# Patient Record
Sex: Female | Born: 2000 | Race: Black or African American | Hispanic: No | Marital: Single | Smoking: Never smoker
Health system: Southern US, Community
[De-identification: ages and names within clinical notes are randomized; demographics above are authoritative.]

## PROBLEM LIST (undated history)

## (undated) DIAGNOSIS — L309 Dermatitis, unspecified: Secondary | ICD-10-CM

## (undated) DIAGNOSIS — E669 Obesity, unspecified: Secondary | ICD-10-CM

## (undated) DIAGNOSIS — F419 Anxiety disorder, unspecified: Secondary | ICD-10-CM

## (undated) DIAGNOSIS — J309 Allergic rhinitis, unspecified: Secondary | ICD-10-CM

## (undated) DIAGNOSIS — F32A Depression, unspecified: Secondary | ICD-10-CM

## (undated) HISTORY — DX: Anxiety disorder, unspecified: F41.9

## (undated) HISTORY — DX: Dermatitis, unspecified: L30.9

## (undated) HISTORY — DX: Depression, unspecified: F32.A

## (undated) HISTORY — DX: Allergic rhinitis, unspecified: J30.9

## (undated) HISTORY — PX: WISDOM TOOTH EXTRACTION: SHX21

## (undated) HISTORY — DX: Obesity, unspecified: E66.9

---
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
## 2022-08-16 ENCOUNTER — Encounter: Payer: Self-pay | Admitting: Obstetrics and Gynecology

## 2022-08-16 ENCOUNTER — Ambulatory Visit (INDEPENDENT_AMBULATORY_CARE_PROVIDER_SITE_OTHER): Payer: Medicaid Other | Admitting: Obstetrics and Gynecology

## 2022-08-16 VITALS — BP 111/75 | HR 74 | Wt 242.0 lb

## 2022-08-16 DIAGNOSIS — N906 Unspecified hypertrophy of vulva: Secondary | ICD-10-CM

## 2022-08-16 DIAGNOSIS — N907 Vulvar cyst: Secondary | ICD-10-CM | POA: Diagnosis not present

## 2022-08-16 NOTE — Progress Notes (Signed)
Pt states she is having some vaginal irritation and ?knots on labia.  Pt states this is an ongoing problem.

## 2022-08-16 NOTE — Progress Notes (Signed)
  CC: labial irritation Subjective:    Patient ID: Heidi Lowe, female    DOB: Dec 04, 2001, 21 y.o.   MRN: [id number]  HPI 21 yo GO seen for ongoing labial irritation.  Pt was seen previously the year before for a similar issue and was advised to try topical gel/vaseline to ease irritation.  This has proven to be ineffective, and the patient still complains about her extra labial skin rubbing on her undergarments. Briefly discussed body dysmorphism, but patient does not believe this is the case. She has been sexually active in the past, but is not currently.  The patient also complains of " painful bumps" in the right labia which have been present for about 2 months.     Review of Systems     Objective:   Physical Exam Vitals:   08/16/22 0854  BP: 111/75  Pulse: 74   External genitalia:  bilateral large pendulous labia majora (photos added to media) In the posterior right labia there are visible and palpable masses approximately 1 cm in size in a chain.  They are mildly tender to the touch.       Assessment & Plan:   1. Labial hypertrophy Discussed patient with partners.  Consensus was if the labia are causing persistent discomfort and less invasive treatment have been ineffective, then labioplasty may be an option.  Discussed with Dr. Despina Hidden and pt may be scheduled if she would like to go that route.  2. Epidermoid cyst of labia majora Removal at time of labioplasty if possible.  Awaiting pt's decision on treatment options.    Warden Fillers, MD Faculty Attending, [facility]

## 2022-09-15 ENCOUNTER — Encounter (HOSPITAL_COMMUNITY): Payer: Self-pay | Admitting: Obstetrics & Gynecology

## 2022-09-15 ENCOUNTER — Other Ambulatory Visit: Payer: Self-pay

## 2022-09-15 NOTE — Progress Notes (Signed)
PCP - Dr. Thornell Sartorius, N.  Cardiologist - Denies  EP- Denies  Endocrine- Denies  Pulm- Denies  Chest x-ray - Denies  EKG - Denies  Stress Test - Denies  ECHO - Denies  Cardiac Cath - Denies  AICD-na PM-na LOOP-na  Nerve Stimulator- Denies  Dialysis- Denies  Sleep Study - Denies CPAP - Denies  LABS- 09/16/22: UPT  ASA- Denies  ERAS- No  HA1C- Denies  Anesthesia- No  Pt denies having chest pain, sob, or fever during the pre-op phone call. All instructions explained to the pt, with a verbal understanding of the material. Pt also instructed to wear a mask and social distance if she goes out. The opportunity to ask questions was provided.

## 2022-09-15 NOTE — Progress Notes (Signed)
S.D.W- Instructions   Your procedure is scheduled on Thurs., Oct. 12, 2023 from 10:00AM-11:00AM.  Report to [facility] Main Entrance "A" at 7:30 A.M., then check in with the Admitting office.  Call this number if you have problems the morning of surgery:  [phone]   Remember:  Do not eat or drink after midnight on Oct. 11th    Take these medicines the morning of surgery with A SIP OF WATER:  None  As of today, STOP taking any Aspirin (unless otherwise instructed by your surgeon) Aleve, Naproxen, Ibuprofen, Motrin, Advil, Goody's, BC's, all herbal medications, fish oil, and all vitamins.  Do not wear jewelry or makeup. Do not wear lotions, powders, perfumes or deodorant. Do not shave 48 hours prior to surgery.   Do not bring valuables to the hospital. Do not wear nail polish, gel polish, artificial nails, or any other type of covering on natural nails (fingers and toes) If you have artificial nails or gel coating that need to be removed by a nail salon, please have this removed prior to surgery. Artificial nails or gel coating may interfere with anesthesia's ability to adequately monitor your vital signs.  [facility] is not responsible for any belongings or valuables.    Do NOT Smoke (Tobacco/Vaping)  24 hours prior to your procedure  If you use a CPAP at night, you may bring your mask for your overnight stay.   Contacts, glasses, hearing aids, dentures or partials may not be worn into surgery, please bring cases for these belongings   For patients admitted to the hospital, discharge time will be determined by your treatment team.   Patients discharged the day of surgery will not be allowed to drive home, and someone needs to stay with them for 24 hours.  Special instructions:    Oral Hygiene is also important to reduce your risk of infection.  Remember - BRUSH YOUR TEETH THE MORNING OF SURGERY WITH YOUR REGULAR TOOTHPASTE  - Preparing For Surgery  Before  surgery, you can play an important role. Because skin is not sterile, your skin needs to be as free of germs as possible. You can reduce the number of germs on your skin by washing with Antibacterial Soap before surgery.     Please follow these instructions carefully.     Shower the NIGHT BEFORE SURGERY and the MORNING OF SURGERY with Antibacterial Soap.   Pat yourself dry with a CLEAN TOWEL.  Wear CLEAN PAJAMAS to bed the night before surgery  Place CLEAN SHEETS on your bed the night before your surgery  DO NOT SLEEP WITH PETS.  Day of Surgery:  Take a shower with Antibacterial soap. Wear Clean/Comfortable clothing the morning of surgery Do not apply any deodorants/lotions.   Remember to brush your teeth WITH YOUR REGULAR TOOTHPASTE.   If you test positive for Covid, or been in contact with anyone that has tested positive in the last 10 days, please notify your surgeon.  SURGICAL WAITING ROOM VISITATION Patients having surgery or a procedure may have no more than 2 support people in the waiting area - these visitors may rotate.   Children under the age of 61 must have an adult with them who is not the patient. If the patient needs to stay at the hospital during part of their recovery, the visitor guidelines for inpatient rooms apply. Pre-op nurse will coordinate an appropriate time for 1 support person to accompany patient in pre-op.  This support person may not rotate.  Please refer to the [facility] website for the visitor guidelines for Inpatients (after your surgery is over and you are in a regular room).

## 2022-09-16 ENCOUNTER — Other Ambulatory Visit: Payer: Self-pay

## 2022-09-16 ENCOUNTER — Ambulatory Visit (HOSPITAL_BASED_OUTPATIENT_CLINIC_OR_DEPARTMENT_OTHER): Payer: Medicaid Other | Admitting: Certified Registered Nurse Anesthetist

## 2022-09-16 ENCOUNTER — Encounter (HOSPITAL_COMMUNITY): Payer: Self-pay | Admitting: Obstetrics & Gynecology

## 2022-09-16 ENCOUNTER — Other Ambulatory Visit: Payer: Self-pay | Admitting: Obstetrics & Gynecology

## 2022-09-16 ENCOUNTER — Ambulatory Visit (HOSPITAL_COMMUNITY)
Admission: RE | Admit: 2022-09-16 | Discharge: 2022-09-16 | Disposition: A | Discharge status: Home / Self Care | Payer: Medicaid Other | Source: Ambulatory Visit | Attending: Obstetrics & Gynecology | Admitting: Obstetrics & Gynecology

## 2022-09-16 ENCOUNTER — Encounter (HOSPITAL_COMMUNITY)
Admission: RE | Disposition: A | Discharge status: Home / Self Care | Payer: Self-pay | Source: Ambulatory Visit | Attending: Obstetrics & Gynecology

## 2022-09-16 ENCOUNTER — Ambulatory Visit (HOSPITAL_COMMUNITY): Payer: Medicaid Other | Admitting: Certified Registered Nurse Anesthetist

## 2022-09-16 DIAGNOSIS — N906 Unspecified hypertrophy of vulva: Secondary | ICD-10-CM

## 2022-09-16 DIAGNOSIS — N9089 Other specified noninflammatory disorders of vulva and perineum: Secondary | ICD-10-CM

## 2022-09-16 DIAGNOSIS — N9489 Other specified conditions associated with female genital organs and menstrual cycle: Secondary | ICD-10-CM | POA: Diagnosis not present

## 2022-09-16 DIAGNOSIS — N907 Vulvar cyst: Secondary | ICD-10-CM | POA: Diagnosis not present

## 2022-09-16 DIAGNOSIS — Z01818 Encounter for other preprocedural examination: Secondary | ICD-10-CM

## 2022-09-16 DIAGNOSIS — Z6841 Body Mass Index (BMI) 40.0 and over, adult: Secondary | ICD-10-CM

## 2022-09-16 HISTORY — PX: VULVECTOMY PARTIAL: SHX6187

## 2022-09-16 LAB — CBC
HCT: 37.8 % (ref 36.0–46.0)
Hemoglobin: 12 g/dL (ref 12.0–15.0)
MCH: 25.3 pg — ABNORMAL LOW (ref 26.0–34.0)
MCHC: 31.7 g/dL (ref 30.0–36.0)
MCV: 79.7 fL — ABNORMAL LOW (ref 80.0–100.0)
Platelets: 336 10*3/uL (ref 150–400)
RBC: 4.74 MIL/uL (ref 3.87–5.11)
RDW: 14.3 % (ref 11.5–15.5)
WBC: 6.7 10*3/uL (ref 4.0–10.5)
nRBC: 0 % (ref 0.0–0.2)

## 2022-09-16 LAB — POCT PREGNANCY, URINE: Preg Test, Ur: NEGATIVE

## 2022-09-16 SURGERY — VULVECTOMY, PARTIAL
Anesthesia: General | Site: Vulva

## 2022-09-16 MED ORDER — BUPIVACAINE LIPOSOME 1.3 % IJ SUSP
INTRAMUSCULAR | Status: AC
  Filled 2022-09-16: qty 20

## 2022-09-16 MED ORDER — DEXAMETHASONE SODIUM PHOSPHATE 10 MG/ML IJ SOLN
INTRAMUSCULAR | Status: AC
  Filled 2022-09-16: qty 1

## 2022-09-16 MED ORDER — KETOROLAC TROMETHAMINE 30 MG/ML IJ SOLN
30.0000 mg | Freq: Once | INTRAMUSCULAR | Status: DC
  Filled 2022-09-16: qty 1

## 2022-09-16 MED ORDER — BUPIVACAINE LIPOSOME 1.3 % IJ SUSP
INTRAMUSCULAR | Status: DC | PRN
  Administered 2022-09-16: 20 mL

## 2022-09-16 MED ORDER — FENTANYL CITRATE (PF) 250 MCG/5ML IJ SOLN
INTRAMUSCULAR | Status: AC
  Filled 2022-09-16: qty 5

## 2022-09-16 MED ORDER — BUPIVACAINE LIPOSOME 1.3 % IJ SUSP: 20.0000 mL | Freq: Once | INTRAMUSCULAR | Status: DC

## 2022-09-16 MED ORDER — DEXAMETHASONE SODIUM PHOSPHATE 10 MG/ML IJ SOLN
INTRAMUSCULAR | Status: DC | PRN
  Administered 2022-09-16: 5 mg via INTRAVENOUS

## 2022-09-16 MED ORDER — PROPOFOL 10 MG/ML IV BOLUS
INTRAVENOUS | Status: DC | PRN
  Administered 2022-09-16: 200 mg via INTRAVENOUS

## 2022-09-16 MED ORDER — MIDAZOLAM HCL 2 MG/2ML IJ SOLN
INTRAMUSCULAR | Status: AC
  Filled 2022-09-16: qty 2

## 2022-09-16 MED ORDER — CEFAZOLIN SODIUM-DEXTROSE 2-4 GM/100ML-% IV SOLN
2.0000 g | INTRAVENOUS | Status: AC
  Administered 2022-09-16: 2 g via INTRAVENOUS

## 2022-09-16 MED ORDER — FENTANYL CITRATE (PF) 250 MCG/5ML IJ SOLN
INTRAMUSCULAR | Status: DC | PRN
  Administered 2022-09-16: 50 ug via INTRAVENOUS
  Administered 2022-09-16: 150 ug via INTRAVENOUS
  Administered 2022-09-16: 50 ug via INTRAVENOUS

## 2022-09-16 MED ORDER — KETOROLAC TROMETHAMINE 15 MG/ML IJ SOLN
INTRAMUSCULAR | Status: AC
  Administered 2022-09-16: 15 mg
  Filled 2022-09-16: qty 1

## 2022-09-16 MED ORDER — DEXMEDETOMIDINE HCL IN NACL 80 MCG/20ML IV SOLN
INTRAVENOUS | Status: AC
  Filled 2022-09-16: qty 20

## 2022-09-16 MED ORDER — ORAL CARE MOUTH RINSE: 15.0000 mL | Freq: Once | OROMUCOSAL | Status: AC

## 2022-09-16 MED ORDER — IBUPROFEN 200 MG PO TABS: 200.0000 mg | ORAL_TABLET | Freq: Four times a day (QID) | ORAL | Status: DC | PRN

## 2022-09-16 MED ORDER — ONDANSETRON HCL 4 MG/2ML IJ SOLN: 4.0000 mg | Freq: Once | INTRAMUSCULAR | Status: DC | PRN

## 2022-09-16 MED ORDER — IBUPROFEN 100 MG/5ML PO SUSP: 200.0000 mg | Freq: Four times a day (QID) | ORAL | Status: DC | PRN

## 2022-09-16 MED ORDER — ONDANSETRON HCL 4 MG/2ML IJ SOLN
INTRAMUSCULAR | Status: DC | PRN
  Administered 2022-09-16: 4 mg via INTRAVENOUS

## 2022-09-16 MED ORDER — LIDOCAINE 2% (20 MG/ML) 5 ML SYRINGE
INTRAMUSCULAR | Status: AC
  Filled 2022-09-16: qty 5

## 2022-09-16 MED ORDER — OXYCODONE HCL 5 MG/5ML PO SOLN: 5.0000 mg | Freq: Once | ORAL | Status: DC | PRN

## 2022-09-16 MED ORDER — MEPERIDINE HCL 25 MG/ML IJ SOLN: 6.2500 mg | INTRAMUSCULAR | Status: DC | PRN

## 2022-09-16 MED ORDER — CHLORHEXIDINE GLUCONATE 0.12 % MT SOLN
15.0000 mL | Freq: Once | OROMUCOSAL | Status: AC
  Administered 2022-09-16: 15 mL via OROMUCOSAL
  Filled 2022-09-16: qty 15

## 2022-09-16 MED ORDER — LACTATED RINGERS IV SOLN: INTRAVENOUS | Status: DC

## 2022-09-16 MED ORDER — PROPOFOL 10 MG/ML IV BOLUS
INTRAVENOUS | Status: AC
  Filled 2022-09-16: qty 20

## 2022-09-16 MED ORDER — ACETAMINOPHEN 10 MG/ML IV SOLN: 1000.0000 mg | Freq: Once | INTRAVENOUS | Status: DC | PRN

## 2022-09-16 MED ORDER — CEFAZOLIN SODIUM-DEXTROSE 2-4 GM/100ML-% IV SOLN
INTRAVENOUS | Status: AC
  Filled 2022-09-16: qty 100

## 2022-09-16 MED ORDER — OXYCODONE-ACETAMINOPHEN 5-325 MG PO TABS: 1.0000 | ORAL_TABLET | Freq: Four times a day (QID) | ORAL | 0 refills | Status: DC | PRN

## 2022-09-16 MED ORDER — EPHEDRINE SULFATE-NACL 50-0.9 MG/10ML-% IV SOSY
PREFILLED_SYRINGE | INTRAVENOUS | Status: DC | PRN
  Administered 2022-09-16 (×2): 5 mg via INTRAVENOUS

## 2022-09-16 MED ORDER — OXYCODONE HCL 5 MG PO TABS: 5.0000 mg | ORAL_TABLET | Freq: Once | ORAL | Status: DC | PRN

## 2022-09-16 MED ORDER — ONDANSETRON 8 MG PO TBDP: 8.0000 mg | ORAL_TABLET | Freq: Three times a day (TID) | ORAL | 0 refills | Status: DC | PRN

## 2022-09-16 MED ORDER — KETOROLAC TROMETHAMINE 10 MG PO TABS: 10.0000 mg | ORAL_TABLET | Freq: Three times a day (TID) | ORAL | 0 refills | Status: DC | PRN

## 2022-09-16 MED ORDER — MIDAZOLAM HCL 2 MG/2ML IJ SOLN
INTRAMUSCULAR | Status: DC | PRN
  Administered 2022-09-16: 2 mg via INTRAVENOUS

## 2022-09-16 MED ORDER — ONDANSETRON HCL 4 MG/2ML IJ SOLN
INTRAMUSCULAR | Status: AC
  Filled 2022-09-16: qty 2

## 2022-09-16 MED ORDER — 0.9 % SODIUM CHLORIDE (POUR BTL) OPTIME
TOPICAL | Status: DC | PRN
  Administered 2022-09-16: 1000 mL

## 2022-09-16 MED ORDER — POVIDONE-IODINE 10 % EX SWAB: 2.0000 | Freq: Once | CUTANEOUS | Status: DC

## 2022-09-16 MED ORDER — FENTANYL CITRATE (PF) 100 MCG/2ML IJ SOLN: 25.0000 ug | INTRAMUSCULAR | Status: DC | PRN

## 2022-09-16 MED ORDER — LIDOCAINE 2% (20 MG/ML) 5 ML SYRINGE
INTRAMUSCULAR | Status: DC | PRN
  Administered 2022-09-16: 100 mg via INTRAVENOUS

## 2022-09-16 MED ORDER — DEXMEDETOMIDINE HCL IN NACL 200 MCG/50ML IV SOLN
INTRAVENOUS | Status: DC | PRN
  Administered 2022-09-16: 4 ug via INTRAVENOUS
  Administered 2022-09-16 (×2): 8 ug via INTRAVENOUS

## 2022-09-16 SURGICAL SUPPLY — 25 items
BLADE SURG 15 STRL LF DISP TIS (BLADE) ×1 IMPLANT
BLADE SURG 15 STRL SS (BLADE) ×1
CATH ROBINSON RED A/P 16FR (CATHETERS) ×1 IMPLANT
ELECT REM PT RETURN 9FT ADLT (ELECTROSURGICAL)
ELECTRODE REM PT RTRN 9FT ADLT (ELECTROSURGICAL) IMPLANT
GLOVE BIOGEL PI IND STRL 7.0 (GLOVE) ×1 IMPLANT
GLOVE ECLIPSE 7.0 STRL STRAW (GLOVE) ×1 IMPLANT
GOWN STRL REUS W/ TWL LRG LVL3 (GOWN DISPOSABLE) ×2 IMPLANT
GOWN STRL REUS W/TWL LRG LVL3 (GOWN DISPOSABLE) ×2
NEEDLE HYPO 22GX1.5 SAFETY (NEEDLE) ×1 IMPLANT
NS IRRIG 1000ML POUR BTL (IV SOLUTION) ×1 IMPLANT
PACK VAGINAL MINOR WOMEN LF (CUSTOM PROCEDURE TRAY) ×1 IMPLANT
PAD OB MATERNITY 4.3X12.25 (PERSONAL CARE ITEMS) ×1 IMPLANT
PENCIL BUTTON HOLSTER BLD 10FT (ELECTRODE) IMPLANT
SUT MON AB 3-0 SH 27 (SUTURE) ×10
SUT MON AB 3-0 SH27 (SUTURE) ×2 IMPLANT
SUT VIC AB 2-0 CT1 27 (SUTURE)
SUT VIC AB 2-0 CT1 TAPERPNT 27 (SUTURE) ×1 IMPLANT
SUT VIC AB 3-0 CT1 27 (SUTURE)
SUT VIC AB 3-0 CT1 TAPERPNT 27 (SUTURE) IMPLANT
SUT VIC AB 3-0 SH 27 (SUTURE)
SUT VIC AB 3-0 SH 27X BRD (SUTURE) ×2 IMPLANT
TOWEL GREEN STERILE FF (TOWEL DISPOSABLE) ×2 IMPLANT
TUBE CONNECTING 12X1/4 (SUCTIONS) ×1 IMPLANT
YANKAUER SUCT BULB TIP NO VENT (SUCTIONS) ×1 IMPLANT

## 2022-09-16 NOTE — Op Note (Signed)
Preoperative diagnosis: Bilateral labial hypertrophy                                         Labial hypertrophy causing discomfort at rest and sexual activity                                        Sebaceous cyst right labia  Postoperative diagnosis: Same as above  Procedure: Bilateral labioplasty  Surgeon:Celester Morgan Chriss Driver, MD   Anesthesia: Laryngeal mask airway  Findings: Patient was evaluated by Dr. Elgie Congo who communicated with me directly via phone and also with pictures in the chart Patient had been complaining of bilateral labial hypertrophy left greater than right but bilateral causing sexual dysfunction and discomfort He asked if I would consider performing her surgery and he consulted extensively with the patient and her family and I said I would  Intraoperatively was found to have significant bilateral labial hypertrophy pictured below   In the right labia there was a significant number of sebaceous cyst as well But was unsure if I was going to remove these separately but in fact they were within the baby and that was to be removed anyway   Description of operation: Patient was taken to the operating room and placed in the supine position where she underwent laryngeal mask airway anesthesia She received Ancef and Toradol preoperatively prophylactically She was placed in dorsolithotomy position and was prepped and draped in the usual sterile fashion Curved Mayo scissors were used essentially excised the excess labia using the labial minora extended all the vagina and perineum as the guide for how much tissue to remove I then took the labial removal cephalad to the base of the clitoral crura bilaterally being careful not to involve the clitoral crura being careful not to involve the clitoris itself In order to make it cosmetically symmetrical based on size of the distal labia, the removal was taken to the base of the crural bilaterally.  Because of the large size of her labia majora  there is infolding of the area of removal and dissection so I have included 2 pictures below First of which is traction and the second which is relaxed I removed labial tissue first on the left perform the closure and then the right to limit blood loss during the procedure hemostasis was achieved with interrupted and interrupted figure-of-eight sutures paying particular attention to the area around the clitoris for anatomical reapproximation There was some resultant swelling as is it would be expected around the clitoris secondary to the surgery However this should resolve as surgical bed heals   10 cc of Exparel was injected on either side postoperative pain management There was good hemostasis of all pedicles at the end of the procedure and I watched it for greater than 5 minutes and palpated to make sure there was no hematoma formation of the vaginal sidewall  Reviewed aftercare with the patient preoperatively and with her mother and partner postoperatively  I will see her in the office in 2 weeks for postoperative visit  She had a pain score of 0 in the recovery room and there was no bleeding on her pad in the recovery room  Blood loss for the procedure was 150 cc  Florian Buff, MD 09/16/2022 12:19 PM

## 2022-09-16 NOTE — Anesthesia Procedure Notes (Addendum)
Procedure Name: LMA Insertion Date/Time: 09/16/2022 10:27 AM  Performed by: Michele Rockers, CRNAPre-anesthesia Checklist: Patient identified, Emergency Drugs available, Suction available, Timeout performed and Patient being monitored Patient Re-evaluated:Patient Re-evaluated prior to induction Oxygen Delivery Method: Non-rebreather mask Preoxygenation: Pre-oxygenation with 100% oxygen Induction Type: IV induction LMA: LMA inserted LMA Size: 4.0 Laser Tube: Cuffed inflated with minimal occlusive pressure - saline Placement Confirmation: positive ETCO2 and breath sounds checked- equal and bilateral Tube secured with: Tape Dental Injury: Teeth and Oropharynx as per pre-operative assessment

## 2022-09-16 NOTE — Transfer of Care (Signed)
Immediate Anesthesia Transfer of Care Note  Patient: Heidi Lowe  Procedure(s) Performed: VULVECTOMY PARTIAL & EXCISION OF LABIAL CYST (Vulva)  Patient Location: PACU  Anesthesia Type:General  Level of Consciousness: drowsy, patient cooperative, and responds to stimulation  Airway & Oxygen Therapy: Patient Spontanous Breathing and Patient connected to face mask oxygen  Post-op Assessment: Report given to RN, Post -op Vital signs reviewed and stable, and Patient moving all extremities X 4  Post vital signs: Reviewed and stable  Last Vitals:  Vitals Value Taken Time  BP 143/70 09/16/22 1222  Temp    Pulse 89 09/16/22 1225  Resp 20 09/16/22 1225  SpO2 100 % 09/16/22 1225  Vitals shown include unvalidated device data.  Last Pain:  Vitals:   09/16/22 0820  TempSrc:   PainSc: 0-No pain      Patients Stated Pain Goal: 0 (19/16/60 6004)  Complications: No notable events documented.

## 2022-09-16 NOTE — Anesthesia Preprocedure Evaluation (Signed)
Anesthesia Evaluation  Patient identified by MRN, date of birth, ID band Patient awake    Reviewed: Allergy & Precautions, NPO status , Patient's Chart, lab work & pertinent test results  Airway Mallampati: I       Dental no notable dental hx.    Pulmonary neg pulmonary ROS,    Pulmonary exam normal        Cardiovascular negative cardio ROS Normal cardiovascular exam     Neuro/Psych PSYCHIATRIC DISORDERS negative neurological ROS     GI/Hepatic negative GI ROS, Neg liver ROS,   Endo/Other  Morbid obesity  Renal/GU negative Renal ROS  negative genitourinary   Musculoskeletal negative musculoskeletal ROS (+)   Abdominal (+) + obese,   Peds  Hematology negative hematology ROS (+)   Anesthesia Other Findings   Reproductive/Obstetrics                             Anesthesia Physical Anesthesia Plan  ASA: 3  Anesthesia Plan: General   Post-op Pain Management: Dilaudid IV   Induction: Intravenous  PONV Risk Score and Plan: 4 or greater and Ondansetron, Dexamethasone and Midazolam  Airway Management Planned: LMA  Additional Equipment: None  Intra-op Plan:   Post-operative Plan: Extubation in OR  Informed Consent: I have reviewed the patients History and Physical, chart, labs and discussed the procedure including the risks, benefits and alternatives for the proposed anesthesia with the patient or authorized representative who has indicated his/her understanding and acceptance.       Plan Discussed with: CRNA  Anesthesia Plan Comments:         Anesthesia Quick Evaluation

## 2022-09-16 NOTE — H&P (Signed)
Preoperative History and Physical  Heidi Lowe is a 21 y.o. G0P0000 with Patient's last menstrual period was 09/07/2022 (approximate). admitted for a labioplasty, for disproportionate labial size, causing discomfort in daily living. Left>>right but will assess if right side also needsintervention, pictures provided by Dr Elgie Congo did not suggest that both sides were a problem Also with multiple sebaceous cysts, right vulva mainly, will identify and remove.  PMH:    Past Medical History:  Diagnosis Date   Allergic rhinitis 04/21/2011   Anxiety    [employer]reesia 06/29/2020   Depression    [employer]reesia 06/29/2020   Eczema 01/14/2004   Obesity 04/10/2013    PSH:     Past Surgical History:  Procedure Laterality Date   TONSILLECTOMY  06/06/2011   Had T&A   WISDOM TOOTH EXTRACTION      POb/GynH:      OB History     Gravida  0   Para  0   Term  0   Preterm  0   AB  0   Living  0      SAB  0   IAB  0   Ectopic  0   Multiple  0   Live Births  0           SH:   Social History   Tobacco Use   Smoking status: Never   Smokeless tobacco: Never   Tobacco comments:    smoking outside of the home  Vaping Use   Vaping Use: Never used  Substance Use Topics   Alcohol use: No   Drug use: No    FH:    Family History  Problem Relation Age of Onset   Drug abuse Father    Asthma Brother    Diabetes Maternal Grandfather    Hypertension Maternal Grandfather    Mental illness Maternal Grandfather    Heart disease Paternal Grandmother    Thyroid disease Paternal Grandmother      Allergies: No Known Allergies  Medications:       Current Facility-Administered Medications:    bupivacaine liposome (EXPAREL) 1.3 % injection 266 mg, 20 mL, Infiltration, Once, Clarine Elrod, Mertie Clause, MD   ceFAZolin (ANCEF) 2-4 GM/100ML-% IVPB, , , ,    ceFAZolin (ANCEF) IVPB 2g/100 mL premix, 2 g, Intravenous, On Call to OR, Florian Buff, MD   ketorolac (TORADOL) 30 MG/ML injection 30 mg, 30  mg, Intravenous, Once, Todd Argabright, Mertie Clause, MD   lactated ringers infusion, , Intravenous, Continuous, Hatchett, Franklin, MD, Last Rate: 10 mL/hr at 09/16/22 0934, Continued from Pre-op at 09/16/22 0934   povidone-iodine 10 % swab 2 Application, 2 Application, Topical, Once, Florian Buff, MD  Review of Systems:   Review of Systems  Constitutional: Negative for fever, chills, weight loss, malaise/fatigue and diaphoresis.  HENT: Negative for hearing loss, ear pain, nosebleeds, congestion, sore throat, neck pain, tinnitus and ear discharge.   Eyes: Negative for blurred vision, double vision, photophobia, pain, discharge and redness.  Respiratory: Negative for cough, hemoptysis, sputum production, shortness of breath, wheezing and stridor.   Cardiovascular: Negative for chest pain, palpitations, orthopnea, claudication, leg swelling and PND.  Gastrointestinal: Positive for abdominal pain. Negative for heartburn, nausea, vomiting, diarrhea, constipation, blood in stool and melena.  Genitourinary: Negative for dysuria, urgency, frequency, hematuria and flank pain.  Musculoskeletal: Negative for myalgias, back pain, joint pain and falls.  Skin: Negative for itching and rash.  Neurological: Negative for dizziness, tingling, tremors, sensory change, speech change, focal weakness,  seizures, loss of consciousness, weakness and headaches.  Endo/Heme/Allergies: Negative for environmental allergies and polydipsia. Does not bruise/bleed easily.  Psychiatric/Behavioral: Negative for depression, suicidal ideas, hallucinations, memory loss and substance abuse. The patient is not nervous/anxious and does not have insomnia.      PHYSICAL EXAM:  Blood pressure 134/76, pulse 89, temperature 98.9 F (37.2 C), temperature source Oral, resp. rate 18, height 5\' 2"  (1.575 m), weight 104.3 kg, last menstrual period 09/07/2022, SpO2 98 %.    Vitals reviewed. Constitutional: She is oriented to person, place, and time.  She appears well-developed and well-nourished.  HENT:  Head: Normocephalic and atraumatic.  Right Ear: External ear normal.  Left Ear: External ear normal.  Nose: Nose normal.  Mouth/Throat: Oropharynx is clear and moist.  Eyes: Conjunctivae and EOM are normal. Pupils are equal, round, and reactive to light. Right eye exhibits no discharge. Left eye exhibits no discharge. No scleral icterus.  Neck: Normal range of motion. Neck supple. No tracheal deviation present. No thyromegaly present.  Cardiovascular: Normal rate, regular rhythm, normal heart sounds and intact distal pulses.  Exam reveals no gallop and no friction rub.   No murmur heard. Respiratory: Effort normal and breath sounds normal. No respiratory distress. She has no wheezes. She has no rales. She exhibits no tenderness.  GI: Soft. Bowel sounds are normal. She exhibits no distension and no mass. There is tenderness. There is no rebound and no guarding.  Genitourinary:       Vulva is normal without lesions, left labia large saggy;  multiple sebaceous cysts right vulva noted Vagina is pink moist without discharge Cervix normal in appearance and pap is normal Uterus is normal size, contour, position, consistency, mobility, non-tender Adnexa is negative with normal sized ovaries by sonogram  Musculoskeletal: Normal range of motion. She exhibits no edema and no tenderness.  Neurological: She is alert and oriented to person, place, and time. She has normal reflexes. She displays normal reflexes. No cranial nerve deficit. She exhibits normal muscle tone. Coordination normal.  Skin: Skin is warm and dry. No rash noted. No erythema. No pallor.  Psychiatric: She has a normal mood and affect. Her behavior is normal. Judgment and thought content normal.    Labs: Results for orders placed or performed during the hospital encounter of 09/16/22 (from the past 336 hour(s))  Pregnancy, urine POC   Collection Time: 09/16/22  8:39 AM  Result  Value Ref Range   Preg Test, Ur NEGATIVE NEGATIVE  CBC per protocol   Collection Time: 09/16/22  8:41 AM  Result Value Ref Range   WBC 6.7 4.0 - 10.5 K/uL   RBC 4.74 3.87 - 5.11 MIL/uL   Hemoglobin 12.0 12.0 - 15.0 g/dL   HCT 11/16/22 08.6 - 57.8 %   MCV 79.7 (L) 80.0 - 100.0 fL   MCH 25.3 (L) 26.0 - 34.0 pg   MCHC 31.7 30.0 - 36.0 g/dL   RDW 46.9 62.9 - 52.8 %   Platelets 336 150 - 400 K/uL   nRBC 0.0 0.0 - 0.2 %    EKG: No orders found for this or any previous visit.  Imaging Studies: No results found.    Assessment: Labial hypertrophy, left mainly but patient wants me to also address the right labia as well  Plan: Labioplasty, bilateral, maily left with removal of sebaceous cyst of the vulva  41.3 09/16/2022 10:13 AM

## 2022-09-17 ENCOUNTER — Encounter (HOSPITAL_COMMUNITY): Payer: Self-pay | Admitting: Obstetrics & Gynecology

## 2022-09-17 LAB — SURGICAL PATHOLOGY

## 2022-09-20 NOTE — Anesthesia Postprocedure Evaluation (Signed)
Anesthesia Post Note  Patient: Heidi Lowe  Procedure(s) Performed: VULVECTOMY PARTIAL & EXCISION OF LABIAL CYST (Vulva)     Patient location during evaluation: PACU Anesthesia Type: General Level of consciousness: awake Pain management: pain level controlled Vital Signs Assessment: post-procedure vital signs reviewed and stable Respiratory status: spontaneous breathing Cardiovascular status: stable Postop Assessment: no apparent nausea or vomiting Anesthetic complications: no   No notable events documented.  Last Vitals:  Vitals:   09/16/22 1300 09/16/22 1315  BP: 132/74 137/73  Pulse: 81 77  Resp: 16 16  Temp:  36.7 C  SpO2: 99% 100%    Last Pain:  Vitals:   09/16/22 1315  TempSrc:   PainSc: 0-No pain                 Huston Foley

## 2022-09-22 ENCOUNTER — Telehealth: Payer: Self-pay | Admitting: *Deleted

## 2022-09-22 NOTE — Telephone Encounter (Signed)
Left message @ 10:05 am. JSY

## 2022-09-22 NOTE — Telephone Encounter (Signed)
Pt aware of Dr. Brynda Greathouse recommendations. Pt states she has tried all of that so will just wade it out. Sharon

## 2022-09-22 NOTE — Telephone Encounter (Signed)
Pt had surgery last Thursday. Pt is having a lot of pain. Pt has burning with urination. Mom thinks it's from the urine hitting the incision. Pain med is not helping. Pain is unbearable. What do you advise? Thanks! Plover

## 2022-09-23 ENCOUNTER — Emergency Department (HOSPITAL_BASED_OUTPATIENT_CLINIC_OR_DEPARTMENT_OTHER)
Admission: EM | Admit: 2022-09-23 | Discharge: 2022-09-24 | Disposition: A | Discharge status: Home / Self Care | Payer: Medicaid Other | Attending: Emergency Medicine | Admitting: Emergency Medicine

## 2022-09-23 ENCOUNTER — Encounter (HOSPITAL_BASED_OUTPATIENT_CLINIC_OR_DEPARTMENT_OTHER): Payer: Self-pay

## 2022-09-23 ENCOUNTER — Other Ambulatory Visit: Payer: Self-pay

## 2022-09-23 DIAGNOSIS — R102 Pelvic and perineal pain: Secondary | ICD-10-CM

## 2022-09-23 DIAGNOSIS — N898 Other specified noninflammatory disorders of vagina: Secondary | ICD-10-CM | POA: Diagnosis not present

## 2022-09-23 LAB — URINALYSIS, ROUTINE W REFLEX MICROSCOPIC
Bilirubin Urine: NEGATIVE
Glucose, UA: NEGATIVE mg/dL
Ketones, ur: NEGATIVE mg/dL
Nitrite: NEGATIVE
Specific Gravity, Urine: 1.023 (ref 1.005–1.030)
pH: 7 (ref 5.0–8.0)

## 2022-09-23 LAB — PREGNANCY, URINE: Preg Test, Ur: NEGATIVE

## 2022-09-23 NOTE — ED Triage Notes (Signed)
Patient here POV from Home.  Endorses having a Vulvectomy and Labial Excision 1 Week ago. Since the day afterwards she has been having Discomfort. Discomfort is constant and worse with urination.  Medications have not been effective with controlling pain. Mother has noted some drainage and foul smell ro area.  NAD Noted during Triage. A&Ox4. GCS 15. Ambulatory.

## 2022-09-24 ENCOUNTER — Encounter: Payer: Self-pay | Admitting: Obstetrics & Gynecology

## 2022-09-24 ENCOUNTER — Ambulatory Visit (INDEPENDENT_AMBULATORY_CARE_PROVIDER_SITE_OTHER): Payer: Medicaid Other | Admitting: Obstetrics & Gynecology

## 2022-09-24 VITALS — BP 117/74 | HR 83

## 2022-09-24 DIAGNOSIS — Z9889 Other specified postprocedural states: Secondary | ICD-10-CM

## 2022-09-24 MED ORDER — KETOROLAC TROMETHAMINE 10 MG PO TABS: 10.0000 mg | ORAL_TABLET | Freq: Three times a day (TID) | ORAL | 0 refills | Status: DC | PRN

## 2022-09-24 MED ORDER — SULFAMETHOXAZOLE-TRIMETHOPRIM 800-160 MG PO TABS
1.0000 | ORAL_TABLET | Freq: Once | ORAL | Status: AC
  Administered 2022-09-24: 1 via ORAL
  Filled 2022-09-24: qty 1

## 2022-09-24 MED ORDER — LIDOCAINE HCL (PF) 1 % IJ SOLN
INTRAMUSCULAR | Status: AC
  Filled 2022-09-24: qty 5

## 2022-09-24 MED ORDER — SULFAMETHOXAZOLE-TRIMETHOPRIM 800-160 MG PO TABS: 1.0000 | ORAL_TABLET | Freq: Two times a day (BID) | ORAL | 0 refills | Status: AC

## 2022-09-24 MED ORDER — CEFTRIAXONE SODIUM 1 G IJ SOLR
1.0000 g | Freq: Once | INTRAMUSCULAR | Status: AC
  Administered 2022-09-24: 1 g via INTRAMUSCULAR
  Filled 2022-09-24: qty 10

## 2022-09-24 MED ORDER — HYDROMORPHONE HCL 1 MG/ML IJ SOLN
2.0000 mg | Freq: Once | INTRAMUSCULAR | Status: AC
  Administered 2022-09-24: 2 mg via INTRAMUSCULAR
  Filled 2022-09-24: qty 2

## 2022-09-24 MED ORDER — OXYCODONE-ACETAMINOPHEN 7.5-325 MG PO TABS: 1.0000 | ORAL_TABLET | Freq: Four times a day (QID) | ORAL | 0 refills | Status: DC | PRN

## 2022-09-24 MED ORDER — SILVER SULFADIAZINE 1 % EX CREA: TOPICAL_CREAM | CUTANEOUS | 11 refills | Status: DC

## 2022-09-24 NOTE — Discharge Instructions (Signed)
Begin taking Bactrim as prescribed.  Dr. Elonda Husky [facility] [place] tomorrow between 8 AM and 2 PM according to the on call GYN.  You should call first thing tomorrow morning to make arrangements to be seen.  Continue taking Percocet as previously prescribed as needed for pain.

## 2022-09-24 NOTE — ED Notes (Signed)
Assisted Delo, MD with pelvic exam. Unable to complete exam due to pain. Pt mother became agitated with MD because of lack of OB/GYN team at this facility. MD explained that this is a free-standing ED and that we do not have that resource here. Continuing to monitor patient. Opportunities for questions were offered.

## 2022-09-24 NOTE — ED Provider Notes (Signed)
[facility] EMERGENCY DEPT Provider Note   CSN: [id number] Arrival date & time: 09/23/22  2113     History  Chief Complaint  Patient presents with   Vaginal Pain    Heidi Lowe is a 21 y.o. female.  Patient is an 21 year old female presenting with complaints of vaginal pain.  Patient underwent labioplasty performed by Dr. Despina Hidden 1 week ago.  She describes a discharge and continued discomfort.  She is unable to sit due to the discomfort.  She denies any fevers or chills.  She has been taking Percocet with little relief.  The history is provided by the patient.       Home Medications Prior to Admission medications   Medication Sig Start Date End Date Taking? Authorizing Provider  ketorolac (TORADOL) 10 MG tablet Take 1 tablet (10 mg total) by mouth every 8 (eight) hours as needed. 09/16/22   Lazaro Arms, MD  ondansetron (ZOFRAN-ODT) 8 MG disintegrating tablet Take 1 tablet (8 mg total) by mouth every 8 (eight) hours as needed for nausea or vomiting. 09/16/22   Lazaro Arms, MD  oxyCODONE-acetaminophen (PERCOCET) 5-325 MG tablet Take 1-2 tablets by mouth every 6 (six) hours as needed for severe pain. 09/16/22   Lazaro Arms, MD      Allergies    Patient has no known allergies.    Review of Systems   Review of Systems  All other systems reviewed and are negative.   Physical Exam Updated Vital Signs BP (!) 114/97   Pulse (!) 107   Temp 98.8 F (37.1 C)   Resp 18   Ht 5\' 2"  (1.575 m)   Wt 104.3 kg   LMP 09/07/2022 (Approximate)   SpO2 97%   BMI 42.06 kg/m  Physical Exam Vitals and nursing note reviewed.  Constitutional:      General: She is not in acute distress.    Appearance: Normal appearance. She is not ill-appearing.  HENT:     Head: Normocephalic and atraumatic.  Pulmonary:     Effort: Pulmonary effort is normal.  Genitourinary:    Vagina: Vaginal discharge present.     Comments: The patient labia appear grossly normal without  erythema or swelling.  There is a serosanguineous discharge noted coming from the vagina.  There is exquisite tenderness with any manipulation causing the patient to scream out in pain. Skin:    General: Skin is warm and dry.  Neurological:     Mental Status: She is alert and oriented to person, place, and time.    ED Results / Procedures / Treatments   Labs (all labs ordered are listed, but only abnormal results are displayed) Labs Reviewed  URINALYSIS, ROUTINE W REFLEX MICROSCOPIC - Abnormal; Notable for the following components:      Result Value   APPearance HAZY (*)    Hgb urine dipstick LARGE (*)    Protein, ur TRACE (*)    Leukocytes,Ua LARGE (*)    Bacteria, UA RARE (*)    All other components within normal limits  PREGNANCY, URINE    EKG None  Radiology No results found.  Procedures Procedures    Medications Ordered in ED Medications  cefTRIAXone (ROCEPHIN) injection 1 g (has no administration in time range)  sulfamethoxazole-trimethoprim (BACTRIM DS) 800-160 MG per tablet 1 tablet (has no administration in time range)  HYDROmorphone (DILAUDID) injection 2 mg (2 mg Intramuscular Given 09/24/22 0009)    ED Course/ Medical Decision Making/ A&P  This patient is an  21 year old female accompanied by her mother, significant other, and younger brother for evaluation of vaginal pain.  She is 1 week status post labioplasty performed by Dr. Despina Hidden.  She arrives here afebrile with stable vital signs, and other than being in pain appears clinically-well.  I listened to the patient's complaints, reviewed the record, then attempted to inspect the incision site.  However, even minimal manipulation of the labia caused the patient to scream out in pain.  (This exam was chaperoned by RN Army Chaco).  Given the level of discomfort, I felt as though any further examination would require conscious sedation.  I explained to the patient's mother that the best way to handle this  situation would be for [place] to treat her pain, give antibiotics for presumed infection, then see her surgeon in the morning.  I also explained that this is not my area of expertise, and so as to not put her through this twice, seeing the surgeon who performed the procedure would be in her best interest.    It was quite apparent that this was unacceptable to the mother as she became quite irate, began cursing and swearing, and made quite a scene.  Mom felt as though this was a situation that needed immediate GYN intervention and that I needed to call her surgeon in.  I explained that her surgeon was not on-call and that I no way of getting in touch with him.  I also explained that this facility is a  freestanding emergency department and that there is no in-house GYN specialist.  This made her very unhappy because they waited several hours to be seen and were also told to come here by someone they spoke with who answered for the GYN practice.  I did agree to discuss the situation with the on-call OB/GYN to see if they had additional recommendations.  I spoke with Dr. Macon Large who informed me that she does not perform this specific procedure, but agreed that treating her pain, administering antibiotics, and seeing her surgeon in the morning was a very appropriate course of action.  When I informed the mother of the on-call OB/GYN's recommendations, she began cursing and swearing all over again.    I ordered antibiotics and pain medication, then excused myself from the exam room.  While the nurses were administering her medications, I could hear the mother screaming and yelling profanities all the way down the hall.  I was later informed that the mother accused me of "racism" and used multiple other vulgarities beginning with the letter "F" in reference to me personally and my abilities as a physician.  The mother also made remarks that I "never should have touched her if I didn't know what I was doing", and that I  made her "gush blood".  In reality, all I tried to do was to assess the situation, and I never witnessed any significant bleeding.  The mother was repeatedly verbally abusive towards myself and staff, and her behavior was inappropriate to the point that I was uncomfortable returning to the exam room for a third time.  I had concerns for my physical safety and had no desire to have my character called into question again.  I feel strongly that the patient has been adequately screened, received the appropriate medical care, and that my disposition is the correct one.  She ended up being discharged and will see Dr. Despina Hidden in the morning.    This entire patient encounter was witnessed by RN Jannet Askew,  RN Samuel Bouche, and RN Tanzania Huneycutt.  Final Clinical Impression(s) / ED Diagnoses Final diagnoses:  None    Rx / DC Orders ED Discharge Orders     None         Veryl Speak, MD 09/24/22 [phone]

## 2022-09-24 NOTE — Progress Notes (Signed)
  HPI: Patient returns for routine postoperative follow-up having undergone labioplasty on 09/16/22.  The patient's immediate postoperative recovery has been unremarkable. Since hospital discharge the patient reports local pain discharge.   Current Outpatient Medications: ketorolac (TORADOL) 10 MG tablet, Take 1 tablet (10 mg total) by mouth every 8 (eight) hours as needed., Disp: 15 tablet, Rfl: 0 ketorolac (TORADOL) 10 MG tablet, Take 1 tablet (10 mg total) by mouth every 8 (eight) hours as needed., Disp: 15 tablet, Rfl: 0 ondansetron (ZOFRAN-ODT) 8 MG disintegrating tablet, Take 1 tablet (8 mg total) by mouth every 8 (eight) hours as needed for nausea or vomiting., Disp: 8 tablet, Rfl: 0 silver sulfADIAZINE (SILVADENE) 1 % cream, Apply to area TID, Disp: 50 g, Rfl: 11 sulfamethoxazole-trimethoprim (BACTRIM DS) 800-160 MG tablet, Take 1 tablet by mouth 2 (two) times daily for 7 days., Disp: 14 tablet, Rfl: 0 oxyCODONE-acetaminophen (PERCOCET) 7.5-325 MG tablet, Take 1 tablet by mouth every 6 (six) hours as needed., Disp: 30 tablet, Rfl: 0  No current facility-administered medications for this visit.    Blood pressure 117/74, pulse 83, last menstrual period 09/07/2022.  Physical Exam: Normal healing no infection No hematoma No bruising  Diagnostic Tests:   Pathology: benign  Impression + Management plan:   ICD-10-CM   1. Post-operative state: Labioplasty 09/16/22  Z98.890    healing well, no infection, begin silvadene TID, local care reviewed         Medications Prescribed this encounter: Orders Placed This Encounter     DISCONTD: oxyCODONE-acetaminophen (PERCOCET) 7.5-325 MG tablet         Sig: Take 1 tablet by mouth every 6 (six) hours as needed.         Dispense:  30 tablet         Refill:  0     ketorolac (TORADOL) 10 MG tablet         Sig: Take 1 tablet (10 mg total) by mouth every 8 (eight) hours as needed.         Dispense:  15 tablet         Refill:  0      silver sulfADIAZINE (SILVADENE) 1 % cream         Sig: Apply to area TID         Dispense:  50 g         Refill:  11     oxyCODONE-acetaminophen (PERCOCET) 7.5-325 MG tablet         Sig: Take 1 tablet by mouth every 6 (six) hours as needed.         Dispense:  30 tablet         Refill:  0     Follow up: No follow-ups on file.    Florian Buff, MD Attending Physician for the [facility] 09/24/2022 11:02 AM

## 2022-09-24 NOTE — ED Notes (Signed)
No return call yet received in response to previously documented page by this RN, called [facility] (office where procedure was performed) after hours nurse at [phone]. Learned that the on call MD that the patient's family was referring to is the same MD spoken to by Dr. Stark Jock. Plan remains to discharge patient to return in AM to Dr. Brynda Greathouse office and to take Percocet previously prescribed by office. Abx rx provided by ED MD.

## 2022-09-24 NOTE — ED Notes (Signed)
Discharge pending, Per MD oncall answering service at pt. OBGYN states surgeon will be in office 10/20 0800-1400, will relay this information to patient and patient mother.

## 2022-09-24 NOTE — ED Notes (Signed)
Staff reports to this charge RN that patient's mother is leaving room to yell at staff following a female chaperoned exam of patient's incision . In room with patient, patient's mother expressed that she felt her daughter was being mistreated in the facility and states that she feels her daughter is being mistreated based on race. Listened to concerns and reassured her that staff is committed to good care. Patient's mother continues to express frustration but agrees to allow RN to provide abx (see MAR).  Patient is calm and collected during this RN's medication administration.  Pt mother calls this patient's MD office who performed the procedure and requests that this RN speak to on call physician via phone. Charge RN number provided to the office's triage RN to have on call MD paged.

## 2022-09-24 NOTE — ED Notes (Addendum)
Patient mother becoming very verbally aggressive with this RN and MD. Educated mother on patient condition and steps necessary to be taken to further care. Patient mother yelling and shouting over this RN and MD. Calmly asked pt. Mother not to yell at staff. Patient mother unreceptive to requests and still being verbally aggressive/abusive with staff members.   Patient mother shouting MD is not qualified to touch her daughter and should not have even seen her if she needed to see her Psychologist, sport and exercise.

## 2022-09-30 ENCOUNTER — Encounter: Payer: Medicaid Other | Admitting: Obstetrics & Gynecology

## 2022-10-05 ENCOUNTER — Ambulatory Visit (INDEPENDENT_AMBULATORY_CARE_PROVIDER_SITE_OTHER): Payer: Medicaid Other | Admitting: Obstetrics & Gynecology

## 2022-10-05 VITALS — BP 121/82 | HR 82

## 2022-10-05 DIAGNOSIS — Z9889 Other specified postprocedural states: Secondary | ICD-10-CM

## 2022-10-05 NOTE — Progress Notes (Signed)
  HPI: Patient returns for routine postoperative follow-up having undergone labioplasty on 09/16/22.  The patient's immediate postoperative recovery has been unremarkable. Since hospital discharge the patient reports doing much much better.   Current Outpatient Medications: ketorolac (TORADOL) 10 MG tablet, Take 1 tablet (10 mg total) by mouth every 8 (eight) hours as needed., Disp: 15 tablet, Rfl: 0 oxyCODONE-acetaminophen (PERCOCET) 7.5-325 MG tablet, Take 1 tablet by mouth every 6 (six) hours as needed., Disp: 30 tablet, Rfl: 0 silver sulfADIAZINE (SILVADENE) 1 % cream, Apply to area TID, Disp: 50 g, Rfl: 11 ketorolac (TORADOL) 10 MG tablet, Take 1 tablet (10 mg total) by mouth every 8 (eight) hours as needed. (Patient not taking: Reported on 10/05/2022), Disp: 15 tablet, Rfl: 0 ondansetron (ZOFRAN-ODT) 8 MG disintegrating tablet, Take 1 tablet (8 mg total) by mouth every 8 (eight) hours as needed for nausea or vomiting. (Patient not taking: Reported on 10/05/2022), Disp: 8 tablet, Rfl: 0  No current facility-administered medications for this visit.    Blood pressure 121/82, pulse 82, last menstrual period 09/07/2022.  Physical Exam: Surgical site is healing very well No evidence of infection No hematoma No fusion  Diagnostic Tests:   Pathology:   Impression + Management plan: (F16.384) Post operative state  (primary encounter diagnosis) Comment: healing well, begin topical estrogen to prevent fusion     Medications Prescribed this encounter: No orders of the defined types were placed in this encounter.  Samples of premarin vaginal cream given to prevent post operative fusion   Follow up: Return in about 17 days (around 10/22/2022) for [place], with Dr Elonda Husky.    Florian Buff, MD Attending Physician for the [facility] 10/05/2022 11:17 AM

## 2022-10-21 ENCOUNTER — Encounter: Payer: Self-pay | Admitting: Obstetrics & Gynecology

## 2022-10-21 ENCOUNTER — Ambulatory Visit (INDEPENDENT_AMBULATORY_CARE_PROVIDER_SITE_OTHER): Payer: Medicaid Other | Admitting: Obstetrics & Gynecology

## 2022-10-21 VITALS — BP 112/74 | HR 86 | Ht 62.0 in | Wt 242.0 lb

## 2022-10-21 DIAGNOSIS — Z9889 Other specified postprocedural states: Secondary | ICD-10-CM

## 2022-10-21 MED ORDER — ESTROGENS CONJUGATED 0.625 MG/GM VA CREA: TOPICAL_CREAM | VAGINAL | 11 refills | Status: DC

## 2022-10-21 NOTE — Progress Notes (Signed)
  HPI: Patient returns for routine postoperative follow-up having undergone labioplasty  on 09/16/22.  The patient's immediate postoperative recovery has been unremarkable. Since hospital discharge the patient reports no problems doing well.   Current Outpatient Medications: ketorolac (TORADOL) 10 MG tablet, Take 1 tablet (10 mg total) by mouth every 8 (eight) hours as needed., Disp: 15 tablet, Rfl: 0 oxyCODONE-acetaminophen (PERCOCET) 7.5-325 MG tablet, Take 1 tablet by mouth every 6 (six) hours as needed., Disp: 30 tablet, Rfl: 0 silver sulfADIAZINE (SILVADENE) 1 % cream, Apply to area TID, Disp: 50 g, Rfl: 11 conjugated estrogens (PREMARIN) vaginal cream, 1 gram at bedtime, Disp: 30 g, Rfl: 11  No current facility-administered medications for this visit.    Blood pressure 112/74, pulse 86, height 5\' 2"  (1.575 m), weight 242 lb (109.8 kg), last menstrual period 10/20/2022.  Physical Exam: Normally healing labioplasty no evidence of infection  Diagnostic Tests:   Pathology: benign  Impression + Management plan:   ICD-10-CM   1. Post operative state: labioplasty  Z98.890          Medications Prescribed this encounter: Orders Placed This Encounter     conjugated estrogens (PREMARIN) vaginal cream         Sig: 1 gram at bedtime         Dispense:  30 g         Refill:  11     Follow up: Return in about 2 months (around 12/21/2022) for Follow up, with Dr 12/23/2022.    Despina Hidden, MD Attending Physician for the [facility] 10/21/2022 9:27 AM

## 2022-10-22 ENCOUNTER — Encounter: Payer: Medicaid Other | Admitting: Obstetrics & Gynecology

## 2022-12-21 ENCOUNTER — Ambulatory Visit: Payer: Medicaid Other | Admitting: Obstetrics & Gynecology

## 2023-01-21 DIAGNOSIS — M25552 Pain in left hip: Secondary | ICD-10-CM | POA: Diagnosis not present

## 2023-01-21 DIAGNOSIS — M545 Low back pain, unspecified: Secondary | ICD-10-CM | POA: Diagnosis not present

## 2023-02-14 ENCOUNTER — Encounter: Payer: Self-pay | Admitting: Obstetrics & Gynecology

## 2023-02-14 ENCOUNTER — Ambulatory Visit (INDEPENDENT_AMBULATORY_CARE_PROVIDER_SITE_OTHER): Payer: Medicaid Other | Admitting: Obstetrics & Gynecology

## 2023-02-14 VITALS — BP 132/76 | HR 91 | Ht 62.0 in | Wt 245.0 lb

## 2023-02-14 DIAGNOSIS — Z9889 Other specified postprocedural states: Secondary | ICD-10-CM | POA: Diagnosis not present

## 2023-02-14 NOTE — Progress Notes (Signed)
  HPI: Patient returns for routine postoperative follow-up having undergone bilateral partial vulvectomy on 09/16/22.  The patient's immediate postoperative recovery has been unremarkable. Since hospital discharge the patient reports doing well no complaints.   No current outpatient medications on file. No current facility-administered medications for this visit.    Blood pressure 132/76, pulse 91, height 5\' 2"  (1.575 m), weight 245 lb (111.1 kg).  Physical Exam: Completely healed surgical bed no residual swelling or changes, mild pigment changes  Diagnostic Tests:   Pathology: benign  Impression + Management plan:   ICD-10-CM   1. Post operative state: labioplasty  Z98.890      More than 12 weeks post op, missed appt-->doing well no complaints    Medications Prescribed this encounter: No orders of the defined types were placed in this encounter.     Follow up: prn   Florian Buff, MD Attending Physician for the [facility] 02/14/2023 9:16 AM

## 2023-11-24 DIAGNOSIS — Z113 Encounter for screening for infections with a predominantly sexual mode of transmission: Secondary | ICD-10-CM | POA: Diagnosis not present

## 2023-11-24 DIAGNOSIS — Z6841 Body Mass Index (BMI) 40.0 and over, adult: Secondary | ICD-10-CM | POA: Diagnosis not present

## 2023-11-24 DIAGNOSIS — Z0000 Encounter for general adult medical examination without abnormal findings: Secondary | ICD-10-CM | POA: Diagnosis not present

## 2023-11-24 DIAGNOSIS — F419 Anxiety disorder, unspecified: Secondary | ICD-10-CM | POA: Diagnosis not present

## 2023-11-24 DIAGNOSIS — E66813 Obesity, class 3: Secondary | ICD-10-CM | POA: Diagnosis not present

## 2023-11-24 DIAGNOSIS — Z114 Encounter for screening for human immunodeficiency virus [HIV]: Secondary | ICD-10-CM | POA: Diagnosis not present

## 2024-05-15 ENCOUNTER — Emergency Department (HOSPITAL_COMMUNITY)
Admission: EM | Admit: 2024-05-15 | Discharge: 2024-05-15 | Disposition: A | Discharge status: Home / Self Care | Attending: Emergency Medicine | Admitting: Emergency Medicine

## 2024-05-15 ENCOUNTER — Emergency Department (HOSPITAL_COMMUNITY)

## 2024-05-15 ENCOUNTER — Encounter (HOSPITAL_COMMUNITY): Payer: Self-pay

## 2024-05-15 DIAGNOSIS — M542 Cervicalgia: Secondary | ICD-10-CM | POA: Insufficient documentation

## 2024-05-15 DIAGNOSIS — H53149 Visual discomfort, unspecified: Secondary | ICD-10-CM | POA: Diagnosis not present

## 2024-05-15 DIAGNOSIS — R519 Headache, unspecified: Secondary | ICD-10-CM | POA: Insufficient documentation

## 2024-05-15 LAB — CBC WITH DIFFERENTIAL/PLATELET
Abs Immature Granulocytes: 0.02 10*3/uL (ref 0.00–0.07)
Basophils Absolute: 0.1 10*3/uL (ref 0.0–0.1)
Basophils Relative: 1 %
Eosinophils Absolute: 0.2 10*3/uL (ref 0.0–0.5)
Eosinophils Relative: 3 %
HCT: 36.3 % (ref 36.0–46.0)
Hemoglobin: 10.7 g/dL — ABNORMAL LOW (ref 12.0–15.0)
Immature Granulocytes: 0 %
Lymphocytes Relative: 46 %
Lymphs Abs: 2.8 10*3/uL (ref 0.7–4.0)
MCH: 23.2 pg — ABNORMAL LOW (ref 26.0–34.0)
MCHC: 29.5 g/dL — ABNORMAL LOW (ref 30.0–36.0)
MCV: 78.6 fL — ABNORMAL LOW (ref 80.0–100.0)
Monocytes Absolute: 0.4 10*3/uL (ref 0.1–1.0)
Monocytes Relative: 6 %
Neutro Abs: 2.8 10*3/uL (ref 1.7–7.7)
Neutrophils Relative %: 44 %
Platelets: 386 10*3/uL (ref 150–400)
RBC: 4.62 MIL/uL (ref 3.87–5.11)
RDW: 15.9 % — ABNORMAL HIGH (ref 11.5–15.5)
WBC: 6.3 10*3/uL (ref 4.0–10.5)
nRBC: 0.5 % — ABNORMAL HIGH (ref 0.0–0.2)

## 2024-05-15 LAB — COMPREHENSIVE METABOLIC PANEL WITH GFR
ALT: 14 U/L (ref 0–44)
AST: 16 U/L (ref 15–41)
Albumin: 3.8 g/dL (ref 3.5–5.0)
Alkaline Phosphatase: 74 U/L (ref 38–126)
Anion gap: 7 (ref 5–15)
BUN: 12 mg/dL (ref 6–20)
CO2: 23 mmol/L (ref 22–32)
Calcium: 8.9 mg/dL (ref 8.9–10.3)
Chloride: 105 mmol/L (ref 98–111)
Creatinine, Ser: 0.76 mg/dL (ref 0.44–1.00)
GFR, Estimated: >60 mL/min (ref >60)
Glucose, Bld: 119 mg/dL — ABNORMAL HIGH (ref 70–99)
Potassium: 3.7 mmol/L (ref 3.5–5.1)
Sodium: 135 mmol/L (ref 135–145)
Total Bilirubin: 0.5 mg/dL (ref 0.0–1.2)
Total Protein: 7.7 g/dL (ref 6.5–8.1)

## 2024-05-15 LAB — HCG, SERUM, QUALITATIVE: Preg, Serum: NEGATIVE

## 2024-05-15 MED ORDER — IOHEXOL 350 MG/ML SOLN
75.0000 mL | Freq: Once | INTRAVENOUS | Status: AC | PRN
  Administered 2024-05-15: 75 mL via INTRAVENOUS

## 2024-05-15 MED ORDER — KETOROLAC TROMETHAMINE 15 MG/ML IJ SOLN
15.0000 mg | Freq: Once | INTRAMUSCULAR | Status: AC
  Administered 2024-05-15: 15 mg via INTRAVENOUS
  Filled 2024-05-15: qty 1

## 2024-05-15 MED ORDER — SODIUM CHLORIDE 0.9 % IV BOLUS
1000.0000 mL | Freq: Once | INTRAVENOUS | Status: AC
  Administered 2024-05-15: 1000 mL via INTRAVENOUS

## 2024-05-15 MED ORDER — METOCLOPRAMIDE HCL 5 MG/ML IJ SOLN
10.0000 mg | Freq: Once | INTRAMUSCULAR | Status: AC
  Administered 2024-05-15: 10 mg via INTRAVENOUS
  Filled 2024-05-15: qty 2

## 2024-05-15 MED ORDER — DIPHENHYDRAMINE HCL 50 MG/ML IJ SOLN
25.0000 mg | Freq: Once | INTRAMUSCULAR | Status: AC
  Administered 2024-05-15: 25 mg via INTRAVENOUS
  Filled 2024-05-15: qty 1

## 2024-05-15 MED ORDER — SODIUM CHLORIDE (PF) 0.9 % IJ SOLN
INTRAMUSCULAR | Status: AC
  Filled 2024-05-15: qty 50

## 2024-05-15 NOTE — Discharge Instructions (Signed)
 If you develop continued, recurrent, or worsening headache, fever, neck stiffness, vomiting, blurry or double vision, weakness or numbness in your arms or legs, trouble speaking, or any other new/concerning symptoms then return to the ER for evaluation.

## 2024-05-15 NOTE — ED Provider Notes (Signed)
 [place] EMERGENCY DEPARTMENT AT [facility] Provider Note   CSN: [id number] Arrival date & time: 05/15/24  8295     History  Chief Complaint  Patient presents with   Headache   Neck Pain    Heidi Lowe is a 23 y.o. female.  HPI 23 year old female presents for evaluation of headache and neck pain.  She states she has a history of headaches over the past few years that she has not seen anyone for.  However over the past month she has been having more frequent left-sided headaches.  Her left I will get watery and sometimes some photophobia.  However no vision changes.  The headaches seem to come and go and are relatively brief, maybe a minute at a time.  They are sharp.  No clear cause for what is causing them or time of day.  Over the last week or so she has now also developed left-sided neck pain.  It is lateral and worsens with certain movements.  Feels like there is a popping sensation.  She denies any weakness or numbness in her extremities.  She has been trying Excedrin with partial relief.  Home Medications Prior to Admission medications   Not on File      Allergies    Patient has no known allergies.    Review of Systems   Review of Systems  Constitutional:  Negative for fever.  Eyes:  Negative for visual disturbance.  Gastrointestinal:  Negative for vomiting.  Musculoskeletal:  Positive for neck pain.  Neurological:  Positive for headaches. Negative for weakness and numbness.    Physical Exam Updated Vital Signs BP (!) 151/75 (BP Location: Left Arm)   Pulse 90   Temp 98.1 F (36.7 C) (Oral)   Resp 16   Ht 5\' 2"  (1.575 m)   Wt 108.9 kg   SpO2 100%   BMI 43.90 kg/m  Physical Exam Vitals and nursing note reviewed.  Constitutional:      General: She is not in acute distress.    Appearance: She is well-developed. She is obese. She is not ill-appearing or diaphoretic.  HENT:     Head: Normocephalic and atraumatic.  Eyes:     Extraocular  Movements: Extraocular movements intact.     Pupils: Pupils are equal, round, and reactive to light.  Neck:     Comments: No reproducible tenderness to her lateral or posterior neck. Cardiovascular:     Rate and Rhythm: Normal rate and regular rhythm.     Heart sounds: Normal heart sounds.  Pulmonary:     Effort: Pulmonary effort is normal.     Breath sounds: Normal breath sounds.  Musculoskeletal:     Cervical back: Normal range of motion. No rigidity.  Skin:    General: Skin is warm and dry.  Neurological:     Mental Status: She is alert.     Comments: CN 3-12 grossly intact. 5/5 strength in all 4 extremities. Grossly normal sensation. Normal finger to nose.      ED Results / Procedures / Treatments   Labs (all labs ordered are listed, but only abnormal results are displayed) Labs Reviewed  COMPREHENSIVE METABOLIC PANEL WITH GFR - Abnormal; Notable for the following components:      Result Value   Glucose, Bld 119 (*)    All other components within normal limits  CBC WITH DIFFERENTIAL/PLATELET - Abnormal; Notable for the following components:   Hemoglobin 10.7 (*)    MCV 78.6 (*)  MCH 23.2 (*)    MCHC 29.5 (*)    RDW 15.9 (*)    nRBC 0.5 (*)    All other components within normal limits  HCG, SERUM, QUALITATIVE    EKG None  Radiology CT ANGIO HEAD NECK W WO CM Result Date: 05/15/2024 CLINICAL DATA:  23 year old female with headache and left side neck pain persistent for 1 week. Sharp pain with movement. EXAM: CT ANGIOGRAPHY HEAD AND NECK WITH AND WITHOUT CONTRAST TECHNIQUE: Multidetector CT imaging of the head and neck was performed using the standard protocol during bolus administration of intravenous contrast. Multiplanar CT image reconstructions and MIPs were obtained to evaluate the vascular anatomy. Carotid stenosis measurements (when applicable) are obtained utilizing NASCET criteria, using the distal internal carotid diameter as the denominator. RADIATION DOSE  REDUCTION: This exam was performed according to the departmental dose-optimization program which includes automated exposure control, adjustment of the mA and/or kV according to patient size and/or use of iterative reconstruction technique. CONTRAST:  75mL OMNIPAQUE IOHEXOL 350 MG/ML SOLN COMPARISON:  None Available. FINDINGS: CT HEAD Brain: Streak artifact at the skull base. CTA images argue against the presence of partially empty sella. Cavum vergae, normal variant. Normal cerebral volume. No midline shift, ventriculomegaly, mass effect, evidence of mass lesion, intracranial hemorrhage or evidence of cortically based acute infarction. Gray-white matter differentiation is within normal limits throughout the brain. Calvarium and skull base: Intact, negative. Paranasal sinuses: Visualized paranasal sinuses and mastoids are clear. Orbits: Visualized orbits and scalp soft tissues are within normal limits. CTA NECK Skeleton: Negative. Upper chest: Negative. Other neck: Nonvascular neck soft tissue spaces are within normal limits. Aortic arch: 4 vessel arch, left vertebral artery arises directly from the arch. No arch atherosclerosis. Right carotid system: Negative. Left carotid system: Negative. Vertebral arteries: Proximal right subclavian artery with early origin of the right vertebral artery, normal on series 17, image 150. Mildly late entry into the transverse foramen (series 16, image 212). Right vertebral artery appears patent and within normal limits to the skull base. Left vertebral artery arises directly from the arch. Late entry into the cervical transverse foramen similar to the right side. Left vertebral appears mildly dominant, patent and within normal limits to the skull base. CTA HEAD Posterior circulation: Patent distal vertebral arteries and vertebrobasilar junction with no plaque or stenosis. Patent PICA origins. Fairly codominant V4 segments. Patent basilar artery, SCA and PCA origins. Small posterior  communicating arteries. Bilateral PCA branches are within normal limits. Anterior circulation: Both ICA siphons are patent with no siphon plaque or stenosis. Posterior communicating artery origins appear normal. Patent carotid termini. Normal MCA and ACA origins. Normal anterior communicating artery. Left ACA is mildly dominant throughout. Bilateral ACA branches are within normal limits. Left MCA M1 segment and bifurcation are patent without stenosis. Right MCA M1 segment and bifurcation are patent without stenosis. Bilateral MCA branches are within normal limits. Venous sinuses: Patent. Arachnoid granulation at the junction of the left transverse and sigmoid sinuses (physiologic). Patent IJ bulbs. Anatomic variants: Left vertebral artery is mildly dominant and arises directly from the aortic arch. The right vertebral origin is somewhat early, and both vertebrals have a late entry into the cervical transverse foramen. Review of the MIP images confirms the above findings IMPRESSION: 1. Normal CTA Head and Neck. 2. Normal CT appearance of the brain. Electronically Signed   By: Marlise Simpers M.D.   On: 05/15/2024 12:31    Procedures Procedures    Medications Ordered in ED Medications  sodium chloride  0.9 % bolus 1,000 mL (0 mLs Intravenous Stopped 05/15/24 1133)  ketorolac  (TORADOL ) 15 MG/ML injection 15 mg (15 mg Intravenous Given 05/15/24 1020)  metoCLOPramide (REGLAN) injection 10 mg (10 mg Intravenous Given 05/15/24 1019)  diphenhydrAMINE (BENADRYL) injection 25 mg (25 mg Intravenous Given 05/15/24 1020)  iohexol (OMNIPAQUE) 350 MG/ML injection 75 mL (75 mLs Intravenous Contrast Given 05/15/24 1153)    ED Course/ Medical Decision Making/ A&P                                 Medical Decision Making Amount and/or Complexity of Data Reviewed Labs: ordered.    Details: Normal WBC. Radiology: ordered and independent interpretation performed.    Details: No head bleed or obvious arterial  dissection.  Risk Prescription drug management.   Patient presents with on and off headache and now some neck pain.  Given the popping sensation and not really having reproducible neck pain, CT was obtained with there is no obvious dissection.  No evidence of aneurysm or head bleed.  Symptoms are a lot better after treatment in the ED.  Given her on and off headaches and longstanding history of headaches we will have her follow-up with neurology but otherwise I have a low suspicion for a CNS emergency such as infection, stroke, etc.  I do not think an emergent MRI is needed.  No visual complaints.  Will discharge home with return precautions.        Final Clinical Impression(s) / ED Diagnoses Final diagnoses:  Left-sided headache    Rx / DC Orders ED Discharge Orders          Ordered    Ambulatory referral to Neurology       Comments: An appointment is requested in approximately: 2 weeks   05/15/24 1254              Jerilynn Montenegro, MD 05/15/24 1426

## 2024-05-15 NOTE — ED Triage Notes (Signed)
 Pt c/o intermittent headache x1 month and L side neck pain x1 week. Pain score 9/10.  Pt reports taking OTC medication w/o relief.  Pt reports sharp pain in neck w/ movement.   Pt reports intermittent drainage from L eye and light sensitivity.

## 2024-09-05 DIAGNOSIS — H4710 Unspecified papilledema: Secondary | ICD-10-CM | POA: Diagnosis not present

## 2024-09-05 DIAGNOSIS — G932 Benign intracranial hypertension: Secondary | ICD-10-CM | POA: Diagnosis not present

## 2024-09-05 DIAGNOSIS — F419 Anxiety disorder, unspecified: Secondary | ICD-10-CM | POA: Diagnosis not present

## 2024-10-11 NOTE — Progress Notes (Signed)
 Initial neurology clinic note  Heidi Lowe MRN: [id number] DOB: 11-May-2001  Referring provider: Murleen Fine, MD  Primary care provider: Pcp, No  Reason for consult:  headaches  Subjective:  This is Ms. Heidi Lowe, a 23 y.o. right-handed female with a medical history of pre-DM, anxiety who presents to neurology clinic with headaches. The patient is alone today.  Patient has had headaches for years. The pain is in the front of her head. It is a throbbing pain. The headaches would be 9/10. It could last hours. It was associated with photophobia, phonophobia, nausea, and vomiting. She would have these headaches 1-2 times per week.  Around 1 year ago, she would have a headache on waking and dizziness. She would take ibuprofen  to help. She went to see eye doctor about a year ago and felt there may be increased pressure (papilledema?). Over the last year, she is having headaches daily. The headache now is more whole head and neck. She feels a pressure in the head. She also experiences blurry vision. Twice she woke up at night and could not see much (looking through a blurred lens). She sat up and could not see well for 20 minutes. She went back to sleep and woke up with it better. She was supposed to see neurology last year, but did not hear from anyone. She got back into see [facility] and per reports the pressure had doubled from prior. 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  Her headaches do not change with position (laying down or standing). Straining will make headaches worse.  She endorses neck pain.  She takes Excedrin at least once daily, sometimes more. This will ease her headaches.   She has never been prescribed medication for headaches.  She has never had a lumbar puncture.  She takes vit D regularly and sometimes iron.  Regarding weight, over the last year  she has gained about 40 lbs due to poor diet and stress.  Smoker: No OCP/hormone use: no; not sexually active Caffiene use: soda couple times per week EtOH use: very rare Restrictive diet: no Family history of neurologic disease including headaches: maternal grandmother with aneurysm; mother with migraines; paternal grandmother strokes and migraines and seizures   MEDICATIONS:  Outpatient Encounter Medications as of 10/19/2024  Medication Sig   acetaZOLAMIDE (DIAMOX) 250 MG tablet Take 1 tablet (250 mg) twice daily for 1 week, then take 1 tablet (250 mg) in the morning and take 2 tablets (500 mg), then take 2 tablets (500 mg) twice daily   Cholecalciferol 1.25 MG (50000 UT) capsule Take 50,000 Units by mouth daily.   No facility-administered encounter medications on file as of 10/19/2024.    PAST MEDICAL HISTORY: Past Medical History:  Diagnosis Date   Allergic rhinitis 04/21/2011   Anxiety    Phreesia 06/29/2020   Depression    Phreesia 06/29/2020   Eczema 01/14/2004   Obesity 04/10/2013    PAST SURGICAL HISTORY: Past Surgical History:  Procedure Laterality Date   TONSILLECTOMY  06/06/2011   Had T&A   VULVECTOMY PARTIAL N/A 09/16/2022   Procedure: VULVECTOMY PARTIAL & EXCISION OF LABIAL CYST;  Surgeon: Jayne Vonn DEL, MD;  Location: [facility] OR;  Service: Gynecology;  Laterality: N/A;   WISDOM TOOTH EXTRACTION      ALLERGIES: No Known Allergies  FAMILY HISTORY: Family History  Problem Relation Age of Onset   Heart  disease Paternal Grandmother    Thyroid  disease Paternal Grandmother    Diabetes Maternal Grandfather    Hypertension Maternal Grandfather    Mental illness Maternal Grandfather    Drug abuse Father    Asthma Brother    Autism Brother     SOCIAL HISTORY: Social History   Tobacco Use   Smoking status: Never   Smokeless tobacco: Never   Tobacco comments:    smoking outside of the home  Vaping Use   Vaping status: Never Used  Substance Use Topics    Alcohol use: No   Drug use: No   Social History   Social History Narrative   Lives with Mom and two sibs   Are you right handed or left handed? Right   Are you currently employed ?    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 yes   Who lives with you?    What type of home do you live in: 1 story or 2 story? one    Caffiene 3 times a week soda    Objective:  Vital Signs:  BP 134/87   Pulse 82   Wt 262 lb (118.8 kg)   SpO2 96%   BMI 47.92 kg/m   General: No acute distress.  Patient appears well-groomed.   Head:  Normocephalic/atraumatic Eyes:  fundi examined: blurred disc margins consistent with bilateral papilledema (left appears worse than right) Neck: supple Heart: regular rate and rhythm Lungs: Clear to auscultation bilaterally. Vascular: No carotid bruits.  Neurological Exam: Mental status: alert and oriented, speech fluent and not dysarthric, language intact.  Cranial nerves: CN I: not tested CN II: pupils equal, round and reactive to light, visual fields intact CN III, IV, VI:  full range of motion, no nystagmus, no ptosis CN V: facial sensation intact. CN VII: upper and lower face symmetric CN VIII: hearing intact CN IX, X: uvula midline CN XI: sternocleidomastoid and trapezius muscles intact CN XII: tongue midline  Bulk & Tone: normal, no fasciculations. Motor:  muscle strength 5/5 throughout Deep Tendon Reflexes:  2+ throughout.   Sensation:  Light touch sensation intact. Finger to nose testing:  Without dysmetria.   Gait:  Normal station and stride.  Romberg negative.   Labs and Imaging review: Internal labs: 05/15/24: CBC w/ diff significant for Hb 10.7, MCV 78.6 CMP significant for glucose 119 hCG negative  External labs: Vit D (09/05/24): low at 15.6  11/24/23: TSH wnl HbA1c: 5.7 HIV non-reactive  Imaging/Procedures: CTA head and neck (05/15/24): FINDINGS: CT HEAD   Brain: Streak artifact at the  skull base. CTA images argue against the presence of partially empty sella. Cavum vergae, normal variant. Normal cerebral volume. No midline shift, ventriculomegaly, mass effect, evidence of mass lesion, intracranial hemorrhage or evidence of cortically based acute infarction. Gray-white matter differentiation is within normal limits throughout the brain.   Calvarium and skull base: Intact, negative.   Paranasal sinuses: Visualized paranasal sinuses and mastoids are clear.   Orbits: Visualized orbits and scalp soft tissues are within normal limits.   CTA NECK   Skeleton: Negative.   Upper chest: Negative.   Other neck: Nonvascular neck soft tissue spaces are within normal limits.   Aortic arch: 4 vessel arch, left vertebral artery arises directly from the arch. No arch atherosclerosis.   Right carotid system: Negative.   Left carotid system: Negative.   Vertebral arteries: Proximal right subclavian artery with early origin of the right vertebral  artery, normal on series 17, image 150. Mildly late entry into the transverse foramen (series 16, image 212). Right vertebral artery appears patent and within normal limits to the skull base.   Left vertebral artery arises directly from the arch. Late entry into the cervical transverse foramen similar to the right side. Left vertebral appears mildly dominant, patent and within normal limits to the skull base.   CTA HEAD   Posterior circulation: Patent distal vertebral arteries and vertebrobasilar junction with no plaque or stenosis. Patent PICA origins. Fairly codominant V4 segments. Patent basilar artery, SCA and PCA origins. Small posterior communicating arteries. Bilateral PCA branches are within normal limits.   Anterior circulation: Both ICA siphons are patent with no siphon plaque or stenosis. Posterior communicating artery origins appear normal. Patent carotid termini. Normal MCA and ACA origins. Normal anterior  communicating artery. Left ACA is mildly dominant throughout. Bilateral ACA branches are within normal limits. Left MCA M1 segment and bifurcation are patent without stenosis. Right MCA M1 segment and bifurcation are patent without stenosis. Bilateral MCA branches are within normal limits.   Venous sinuses: Patent. Arachnoid granulation at the junction of the left transverse and sigmoid sinuses (physiologic). Patent IJ bulbs.   Anatomic variants: Left vertebral artery is mildly dominant and arises directly from the aortic arch. The right vertebral origin is somewhat early, and both vertebrals have a late entry into the cervical transverse foramen.   Review of the MIP images confirms the above findings   IMPRESSION: 1. Normal CTA Head and Neck. 2. Normal CT appearance of the brain.  Assessment/Plan:  Heidi Lowe is a 23 y.o. female who presents for evaluation of headaches and vision changes. She has a relevant medical history of pre-DM, anxiety. Her neurological examination is pertinent for blurred optic disc margins bilaterally, left worse then right, consistent with papilledema, but otherwise normal neurologic examination. Available diagnostic data is significant for normal CT head and CTA head and neck. 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 She currently has headaches daily and taking excedrin at least once daily. It is most important to treat IIH as this is already affecting her vision. I may need to also treat migraines in the future if headaches persist. We discussed warning signs of IIH and that it can improve with weight loss.  PLAN: -Lumbar puncture with opening pressure and high volume tap if elevated - Stat (will also send cell, protein, and glucose) -Start diamox 250 mg twice daily for 1 week, then increase to 250 mg  in the morning and 500 mg in the evening, then increase to 500 mg twice daily -Given that weight loss helps patient will discuss GLP-1 medications with PCP. There is evidence that this treats IIH as well. -Try to reduce exedrin use. May need a migraine prevention medication in the future, but agreed to start one medication at a time.  -Return to clinic on 12/20/24 at 10:30 am  The impression above as well as the plan as outlined below were extensively discussed with the patient (in the company of mother) who voiced understanding. All questions were answered to their satisfaction.  When available, results of the above investigations and possible further recommendations will be communicated to the patient via telephone/MyChart. Patient to call office if not contacted after expected testing turnaround time.   Total time spent reviewing records, interview, history/exam, documentation, and coordination of care on  day of encounter:  65 min   Thank you for allowing me to participate in patient's care.  If I can answer any additional questions, I would be pleased to do so.  Heidi Potters, MD   CC: Pcp, No No address on file  CC: Referring provider: Murleen Fine, MD [address]  [address] [place],  [place] [address]

## 2024-10-19 ENCOUNTER — Ambulatory Visit: Admitting: Neurology

## 2024-10-19 ENCOUNTER — Encounter: Payer: Self-pay | Admitting: Neurology

## 2024-10-19 VITALS — BP 134/87 | HR 82 | Wt 262.0 lb

## 2024-10-19 DIAGNOSIS — G43001 Migraine without aura, not intractable, with status migrainosus: Secondary | ICD-10-CM | POA: Diagnosis not present

## 2024-10-19 DIAGNOSIS — G932 Benign intracranial hypertension: Secondary | ICD-10-CM

## 2024-10-19 MED ORDER — ACETAZOLAMIDE 250 MG PO TABS: ORAL_TABLET | ORAL | 5 refills | Status: AC

## 2024-10-19 NOTE — Patient Instructions (Addendum)
 I saw you today for headaches and vision changes. I think you have high pressure around the brain called idiopathic intracranial hypertension, or IIH, for short. You also probably have migraines. Our first priority will be to lower this pressure so that we protect your vision.  I am ordering a lumbar puncture to check your pressure and drain some fluid. Someone should call you in the next week to schedule. I want it done soon, so let me know if you do not hear from anyone.  I am starting a medication to lower the pressure as well, called Diamox. You will take 250 mg twice daily for 1 week, then increase to 250 mg in the morning and 500 mg in the evening, then increase to 500 mg twice daily (2 tablets twice a day). Let me know if you have difficulty getting the medication or have any trouble on it.  I will be in touch when I have your results.  If you have vision loss, this is a reason to go to the emergency room as you may need an urgent lumbar puncture.  Try to reduce excedrin use as this will cause more headaches as well.  Return to clinic on 12/20/24 at 10:30 am  Please let me know if you have any questions or concerns in the meantime.  The physicians and staff at [facility] are committed to providing excellent care. You may receive a survey requesting feedback about your experience at our office. We strive to receive very good responses to the survey questions. If you feel that your experience would prevent you from giving the office a very good  response, please contact our office to try to remedy the situation. We may be reached at [phone]. Thank you for taking the time out of your busy day to complete the survey.  Venetia Potters, MD [facility]  More migraine information: Be aware of common food triggers:  - Caffeine:  coffee, black tea, cola, Mt. Dew  - Chocolate  - Dairy:  aged cheeses (brie, blue, cheddar, gouda, Parmasan, provolone, romano, Swiss, etc), chocolate  milk, buttermilk, sour cream, limit eggs and yogurt  - Nuts, peanut butter  - Alcohol  - Cereals/grains:  FRESH breads (fresh bagels, sourdough, doughnuts), yeast productions  - Processed/canned/aged/cured meats (pre-packaged deli meats, hotdogs)  - MSG/glutamate:  soy sauce, flavor enhancer, pickled/preserved/marinated foods  - Sweeteners:  aspartame (Equal, Nutrasweet).  Sugar and Splenda are okay  - Vegetables:  legumes (lima beans, lentils, snow peas, fava beans, pinto peans, peas, garbanzo beans), sauerkraut, onions, olives, pickles  - Fruit:  avocados, bananas, citrus fruit (orange, lemon, grapefruit), mango  - Other:  Frozen meals, macaroni and cheese Routine exercise Stay adequately hydrated (aim for 64 oz water daily) Keep headache diary Maintain proper stress management Maintain proper sleep hygiene Do not skip meals Consider supplements:  magnesium citrate 400mg  daily, riboflavin 400mg  daily, coenzyme Q10 100mg  three times daily.

## 2024-10-19 NOTE — Discharge Instructions (Signed)

## 2024-10-22 ENCOUNTER — Telehealth: Payer: Self-pay | Admitting: Neurology

## 2024-10-22 ENCOUNTER — Ambulatory Visit
Admission: RE | Admit: 2024-10-22 | Discharge: 2024-10-22 | Disposition: A | Discharge status: Home / Self Care | Source: Ambulatory Visit | Attending: Neurology | Admitting: Neurology

## 2024-10-22 VITALS — BP 99/58 | HR 68

## 2024-10-22 DIAGNOSIS — G43001 Migraine without aura, not intractable, with status migrainosus: Secondary | ICD-10-CM | POA: Diagnosis not present

## 2024-10-22 DIAGNOSIS — G932 Benign intracranial hypertension: Secondary | ICD-10-CM | POA: Diagnosis not present

## 2024-10-22 NOTE — Telephone Encounter (Signed)
 Heidi Lowe(Self)  PH: [phone]  Lvm stating that she has a Lumbar procedure today and wanted to know if she is able to take meds prior to the appt. Requested a call back.

## 2024-10-22 NOTE — Telephone Encounter (Signed)
 Tried calling patient no answer.

## 2024-10-22 NOTE — Telephone Encounter (Signed)
 Heidi Lowe,    She does not have to stop her diamox. I am noting that she is on it when she has the lumbar puncture.   Thank you,   Venetia   Patient advised.

## 2024-10-23 ENCOUNTER — Telehealth: Payer: Self-pay | Admitting: Neurology

## 2024-10-23 NOTE — Telephone Encounter (Signed)
 Attempted to call patient but had to leave a message on her VM. I called her mother, listed a someone we could contact.  Patient had LP yesterday. It showed elevated pressure, 31 cm of water. I explained that this confirmed the diagnosis of IIH. Patient is taking diamox. Per mom, she has mentioned tingling of fingers.  I asked that mother pass along the message and have the patient call me when any questions, concerns, or issues with medication.  Patient will follow up with me as planned.  Heidi Potters, MD [facility]

## 2024-10-25 ENCOUNTER — Telehealth: Payer: Self-pay | Admitting: Neurology

## 2024-10-25 NOTE — Telephone Encounter (Signed)
 Pt is returning call and she feels the call is about the Lumbar Puncture results. Thanks

## 2024-10-26 NOTE — Telephone Encounter (Signed)
 Called pt and left detailed message per Dr. Leigh  and told to call office for question or concerns.

## 2024-11-07 ENCOUNTER — Telehealth: Payer: Self-pay | Admitting: Neurology

## 2024-11-07 NOTE — Telephone Encounter (Signed)
 Called and reported Dr Venus recommendation. She understood.

## 2024-11-07 NOTE — Telephone Encounter (Signed)
 Who's calling (name and relationship to patient) : Freeman Cha; self   Best contact number: [phone]  Provider they see: Dr. Leigh   Reason for call: Tracee called in wanting to know if she would be able to take her Diamox  and drink for her birthday tomorrow.

## 2024-11-18 LAB — CSF CELL COUNT WITH DIFFERENTIAL
RBC Count, CSF: 8 {cells}/uL — ABNORMAL HIGH
TOTAL NUCLEATED CELL: 0 {cells}/uL (ref 0–5)

## 2024-11-18 LAB — GLUCOSE, CSF: Glucose, CSF: 62 mg/dL (ref 40–80)

## 2024-11-18 LAB — PROTEIN, CSF: Total Protein, CSF: 18 mg/dL (ref 15–45)

## 2024-11-19 ENCOUNTER — Ambulatory Visit: Payer: Self-pay | Admitting: Neurology

## 2024-12-10 NOTE — Progress Notes (Signed)
 "  NEUROLOGY FOLLOW UP OFFICE NOTE  Heidi Lowe [id number]  Subjective:  Heidi Lowe is a 24 y.o. year old right-handed female with a medical history of pre-DM, anxiety who we last saw on 10/19/24 for headaches.  To briefly review: 10/19/24: Patient has had headaches for years. The pain is in the front of her head. It is a throbbing pain. The headaches would be 9/10. It could last hours. It was associated with photophobia, phonophobia, nausea, and vomiting. She would have these headaches 1-2 times per week.   Around 1 year ago, she would have a headache on waking and dizziness. She would take ibuprofen  to help. She went to see eye doctor about a year ago and felt there may be increased pressure (papilledema?). Over the last year, she is having headaches daily. The headache now is more whole head and neck. She feels a pressure in the head. She also experiences blurry vision. Twice she woke up at night and could not see much (looking through a blurred lens). She sat up and could not see well for 20 minutes. She went back to sleep and woke up with it better. She was supposed to see neurology last year, but did not hear from anyone. She got back into see [facility] and per reports the pressure had doubled from prior. 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   Her headaches do not change with position (laying down or standing). Straining will make headaches worse.   She endorses neck pain.   She takes Excedrin at least once daily, sometimes more. This will ease her headaches.    She has never been prescribed medication for headaches.   She has never had a lumbar puncture.   She takes vit D regularly and sometimes iron.   Regarding weight, over the last year she has gained about 40 lbs due to poor diet and stress.   Smoker: No OCP/hormone use: no; not sexually active Caffiene use:  soda couple times per week EtOH use: very rare Restrictive diet: no Family history of neurologic disease including headaches: maternal grandmother with aneurysm; mother with migraines; paternal grandmother strokes and migraines and seizures  Most recent Assessment and Plan (10/19/24): Heidi Lowe is a 24 y.o. female who presents for evaluation of headaches and vision changes. She has a relevant medical history of pre-DM, anxiety. Her neurological examination is pertinent for blurred optic disc margins bilaterally, left worse then right, consistent with papilledema, but otherwise normal neurologic examination. Available diagnostic data is significant for normal CT head and CTA head and neck. 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 She currently has headaches daily and taking excedrin at least once daily. It is most important to treat IIH as this is already affecting her vision. I may need to also treat migraines in the future if headaches persist. We discussed warning signs of IIH and that it can improve with weight loss.   PLAN: -Lumbar puncture with opening pressure and high volume tap if elevated - Stat (will also send cell, protein, and glucose) -Start diamox  250 mg twice daily for 1 week, then increase to 250 mg in the morning and 500 mg in the evening, then increase to 500 mg twice daily -Given that weight loss helps patient will discuss GLP-1 medications  with PCP. There is evidence that this treats IIH as well. -Try to reduce exedrin use. May need a migraine prevention medication in the future, but agreed to start one medication at a time.  Since their last visit: LP showed an opening pressure of 31 cm of water despite her being on Diamox , confirming the diagnosis of IIH. She is taking Diamox  500 mg BID and thinks this is helping her  symptoms. She does have some tingling in her toes. She is not really having headaches now. She will occasionally have head pressure if she forgets to take her medication. Her vision has also been good with no vision loss and only rare blurry vision.  Her PCP has ordered [facility] and is trying to get this approved. She has changed her diet and is exercising but has not seen a lot of weight loss.  MEDICATIONS:  Outpatient Encounter Medications as of 12/20/2024  Medication Sig   acetaZOLAMIDE  (DIAMOX ) 250 MG tablet Take 1 tablet (250 mg) twice daily for 1 week, then take 1 tablet (250 mg) in the morning and take 2 tablets (500 mg), then take 2 tablets (500 mg) twice daily   Cholecalciferol 1.25 MG (50000 UT) capsule Take 50,000 Units by mouth daily.   No facility-administered encounter medications on file as of 12/20/2024.    PAST MEDICAL HISTORY: Past Medical History:  Diagnosis Date   Allergic rhinitis 04/21/2011   Anxiety    [employer] 06/29/2020   Depression    [employer] 06/29/2020   Eczema 01/14/2004   Obesity 04/10/2013    PAST SURGICAL HISTORY: Past Surgical History:  Procedure Laterality Date   TONSILLECTOMY  06/06/2011   Had T&A   VULVECTOMY PARTIAL N/A 09/16/2022   Procedure: VULVECTOMY PARTIAL & EXCISION OF LABIAL CYST;  Surgeon: Jayne Vonn DEL, MD;  Location: [facility] OR;  Service: Gynecology;  Laterality: N/A;   WISDOM TOOTH EXTRACTION      ALLERGIES: Allergies[1]  FAMILY HISTORY: Family History  Problem Relation Age of Onset   Heart disease Paternal Grandmother    Thyroid  disease Paternal Grandmother    Diabetes Maternal Grandfather    Hypertension Maternal Grandfather    Mental illness Maternal Grandfather    Drug abuse Father    Asthma Brother    Autism Brother     SOCIAL HISTORY: Social History[2] Social History   Social History Narrative   Lives with Mom and two sibs   Are you right handed or left handed? Right   Are you currently employed ?    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 yes   Who lives with you?    What type of home do you live in: 1 story or 2 story? one    Caffiene 3 times a week soda      Objective:  Vital Signs:  BP 107/67   Pulse 85   Ht 5' 2 (1.575 m)   Wt 261 lb (118.4 kg)   SpO2 100%   BMI 47.74 kg/m    General: No acute distress.  Patient appears well-groomed.   Head:  Normocephalic/atraumatic Eyes:  fundi examined, mildly blurred disc margins; appears improved from prior Neck: supple Heart: regular rate and rhythm Lungs: Clear to auscultation bilaterally. Vascular: No carotid bruits.  Neurological Exam: Mental status: alert and oriented, speech fluent and not dysarthric, language intact.  Cranial nerves: CN I: not tested CN II: pupils equal, round and reactive to light, visual fields intact CN  III, IV, VI:  full range of motion, no nystagmus, no ptosis CN V: facial sensation intact. CN VII: upper and lower face symmetric CN VIII: hearing intact CN IX, X: uvula midline CN XI: sternocleidomastoid and trapezius muscles intact CN XII: tongue midline  Bulk & Tone: normal, no fasciculations. Motor:  muscle strength 5/5 throughout Deep Tendon Reflexes:  2+ throughout.   Sensation:  Light touch sensation intact. Finger to nose testing:  Without dysmetria.   Gait:  Normal station and stride.   Labs and Imaging review: New results: Lumbar puncture (10/22/24): Opening pressure: 31 cm of water; closing pressure 12 cm of water Routine analysis: 8 RBC, 0 WBC, 18 protein, 62 glucose  Previously reviewed results: 05/15/24: CBC w/ diff significant for Hb 10.7, MCV 78.6 CMP significant for glucose 119 hCG negative   External labs: Vit D (09/05/24): low at 15.6   11/24/23: TSH wnl HbA1c: 5.7 HIV non-reactive   Imaging/Procedures: CTA head and neck (05/15/24): FINDINGS: CT HEAD   Brain: Streak artifact at the skull base. CTA images argue against the  presence of partially empty sella. Cavum vergae, normal variant. Normal cerebral volume. No midline shift, ventriculomegaly, mass effect, evidence of mass lesion, intracranial hemorrhage or evidence of cortically based acute infarction. Gray-white matter differentiation is within normal limits throughout the brain.   Calvarium and skull base: Intact, negative.   Paranasal sinuses: Visualized paranasal sinuses and mastoids are clear.   Orbits: Visualized orbits and scalp soft tissues are within normal limits.   CTA NECK   Skeleton: Negative.   Upper chest: Negative.   Other neck: Nonvascular neck soft tissue spaces are within normal limits.   Aortic arch: 4 vessel arch, left vertebral artery arises directly from the arch. No arch atherosclerosis.   Right carotid system: Negative.   Left carotid system: Negative.   Vertebral arteries: Proximal right subclavian artery with early origin of the right vertebral artery, normal on series 17, image 150. Mildly late entry into the transverse foramen (series 16, image 212). Right vertebral artery appears patent and within normal limits to the skull base.   Left vertebral artery arises directly from the arch. Late entry into the cervical transverse foramen similar to the right side. Left vertebral appears mildly dominant, patent and within normal limits to the skull base.   CTA HEAD   Posterior circulation: Patent distal vertebral arteries and vertebrobasilar junction with no plaque or stenosis. Patent PICA origins. Fairly codominant V4 segments. Patent basilar artery, SCA and PCA origins. Small posterior communicating arteries. Bilateral PCA branches are within normal limits.   Anterior circulation: Both ICA siphons are patent with no siphon plaque or stenosis. Posterior communicating artery origins appear normal. Patent carotid termini. Normal MCA and ACA origins. Normal anterior communicating artery. Left ACA is mildly  dominant throughout. Bilateral ACA branches are within normal limits. Left MCA M1 segment and bifurcation are patent without stenosis. Right MCA M1 segment and bifurcation are patent without stenosis. Bilateral MCA branches are within normal limits.   Venous sinuses: Patent. Arachnoid granulation at the junction of the left transverse and sigmoid sinuses (physiologic). Patent IJ bulbs.   Anatomic variants: Left vertebral artery is mildly dominant and arises directly from the aortic arch. The right vertebral origin is somewhat early, and both vertebrals have a late entry into the cervical transverse foramen.   Review of the MIP images confirms the above findings   IMPRESSION: 1. Normal CTA Head and Neck. 2. Normal CT appearance of the brain.  Assessment/Plan:  This is Heidi Lowe, a 24 y.o. 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 Her lumbar puncture showed an opening pressure of 31 cm of water. She has improved since starting diamox  500 mg BID. She has had no significant headaches or vision changes. She gets mild tingling with Diamox  but is tolerating this well.  Plan: -Continue Diamox  500 mg twice daily -Agree with Georjean if patient can get approved -Encouraged patient to see ophtho regularly for dilated fundoscopy -Patient will call with worsening headaches or vision changes  Return to clinic in 6 months  Total time spent reviewing records, interview, history/exam, documentation, and coordination of care on day of encounter:  30 min  Venetia Potters, MD     [1] No Known Allergies [2]  Social History Tobacco Use   Smoking status: Never   Smokeless tobacco: Never   Tobacco comments:    smoking outside of the home  Vaping Use   Vaping status: Never Used  Substance Use Topics   Alcohol use: No   Drug use: No   "

## 2024-12-20 ENCOUNTER — Encounter: Payer: Self-pay | Admitting: Neurology

## 2024-12-20 ENCOUNTER — Ambulatory Visit: Admitting: Neurology

## 2024-12-20 VITALS — BP 107/67 | HR 85 | Ht 62.0 in | Wt 261.0 lb

## 2024-12-20 DIAGNOSIS — E669 Obesity, unspecified: Secondary | ICD-10-CM | POA: Diagnosis not present

## 2024-12-20 DIAGNOSIS — G932 Benign intracranial hypertension: Secondary | ICD-10-CM | POA: Diagnosis not present

## 2024-12-20 NOTE — Patient Instructions (Addendum)
 You seem to be doing well with no significant headaches or vision changes.  We agreed to make no changes today.  -Continue Diamox  500 mg twice daily  -Agree with Georjean if you can get this approved  -Please see your eye doctor regularly for dilated fundoscopy  -Please call with worsening headaches or vision changes  Return to clinic in 6 months  Please let me know if you have any questions or concerns in the meantime.  The physicians and staff at [facility] are committed to providing excellent care. You may receive a survey requesting feedback about your experience at our office. We strive to receive very good responses to the survey questions. If you feel that your experience would prevent you from giving the office a very good  response, please contact our office to try to remedy the situation. We may be reached at [phone]. Thank you for taking the time out of your busy day to complete the survey.  Venetia Potters, MD [facility]

## 2025-07-05 ENCOUNTER — Ambulatory Visit: Payer: Self-pay | Admitting: Neurology
